# Patient Record
Sex: Male | Born: 1935 | Race: White | Hispanic: No | Marital: Married | Smoking: Former smoker
Health system: Southern US, Community
[De-identification: ages and names within clinical notes are randomized; demographics above are authoritative.]

## PROBLEM LIST (undated history)

## (undated) DIAGNOSIS — R39198 Other difficulties with micturition: Secondary | ICD-10-CM

## (undated) DIAGNOSIS — I480 Paroxysmal atrial fibrillation: Secondary | ICD-10-CM

## (undated) DIAGNOSIS — R0981 Nasal congestion: Secondary | ICD-10-CM

## (undated) DIAGNOSIS — I10 Essential (primary) hypertension: Secondary | ICD-10-CM

## (undated) DIAGNOSIS — C787 Secondary malignant neoplasm of liver and intrahepatic bile duct: Secondary | ICD-10-CM

## (undated) DIAGNOSIS — C801 Malignant (primary) neoplasm, unspecified: Secondary | ICD-10-CM

## (undated) DIAGNOSIS — I639 Cerebral infarction, unspecified: Secondary | ICD-10-CM

## (undated) DIAGNOSIS — M1990 Unspecified osteoarthritis, unspecified site: Secondary | ICD-10-CM

## (undated) DIAGNOSIS — K589 Irritable bowel syndrome without diarrhea: Secondary | ICD-10-CM

## (undated) DIAGNOSIS — R410 Disorientation, unspecified: Secondary | ICD-10-CM

## (undated) DIAGNOSIS — R531 Weakness: Secondary | ICD-10-CM

## (undated) DIAGNOSIS — E785 Hyperlipidemia, unspecified: Secondary | ICD-10-CM

## (undated) DIAGNOSIS — I495 Sick sinus syndrome: Secondary | ICD-10-CM

## (undated) DIAGNOSIS — N189 Chronic kidney disease, unspecified: Secondary | ICD-10-CM

## (undated) DIAGNOSIS — Z972 Presence of dental prosthetic device (complete) (partial): Secondary | ICD-10-CM

## (undated) DIAGNOSIS — K921 Melena: Secondary | ICD-10-CM

## (undated) HISTORY — DX: Unspecified osteoarthritis, unspecified site: M19.90

## (undated) HISTORY — DX: Hyperlipidemia, unspecified: E78.5

## (undated) HISTORY — DX: Melena: K92.1

## (undated) HISTORY — DX: Disorientation, unspecified: R41.0

## (undated) HISTORY — DX: Secondary malignant neoplasm of liver and intrahepatic bile duct: C78.7

## (undated) HISTORY — DX: Cerebral infarction, unspecified: I63.9

## (undated) HISTORY — DX: Presence of dental prosthetic device (complete) (partial): Z97.2

## (undated) HISTORY — DX: Paroxysmal atrial fibrillation: I48.0

## (undated) HISTORY — DX: Essential (primary) hypertension: I10

## (undated) HISTORY — DX: Weakness: R53.1

## (undated) HISTORY — DX: Chronic kidney disease, unspecified: N18.9

## (undated) HISTORY — DX: Sick sinus syndrome: I49.5

## (undated) HISTORY — DX: Other difficulties with micturition: R39.198

## (undated) HISTORY — PX: HERNIA REPAIR: SHX51

## (undated) HISTORY — DX: Nasal congestion: R09.81

## (undated) HISTORY — DX: Malignant (primary) neoplasm, unspecified: C80.1

## (undated) HISTORY — DX: Irritable bowel syndrome, unspecified: K58.9

## (undated) HISTORY — PX: LEG SURGERY: SHX1003

---

## 2003-02-16 ENCOUNTER — Encounter: Payer: Self-pay | Admitting: Emergency Medicine

## 2003-02-16 ENCOUNTER — Emergency Department (HOSPITAL_COMMUNITY): Admission: EM | Admit: 2003-02-16 | Discharge: 2003-02-16 | Payer: Self-pay | Admitting: Emergency Medicine

## 2007-05-17 ENCOUNTER — Inpatient Hospital Stay (HOSPITAL_COMMUNITY): Admission: EM | Admit: 2007-05-17 | Discharge: 2007-05-20 | Payer: Self-pay | Admitting: Emergency Medicine

## 2007-05-19 ENCOUNTER — Encounter (INDEPENDENT_AMBULATORY_CARE_PROVIDER_SITE_OTHER): Payer: Self-pay | Admitting: Internal Medicine

## 2008-10-29 HISTORY — PX: COLON SURGERY: SHX602

## 2008-10-29 HISTORY — PX: RETINAL DETACHMENT SURGERY: SHX105

## 2010-01-27 DIAGNOSIS — K921 Melena: Secondary | ICD-10-CM

## 2010-01-27 HISTORY — DX: Melena: K92.1

## 2010-01-31 ENCOUNTER — Inpatient Hospital Stay (HOSPITAL_COMMUNITY): Admission: EM | Admit: 2010-01-31 | Discharge: 2010-02-09 | Payer: Self-pay | Admitting: Emergency Medicine

## 2010-02-23 ENCOUNTER — Ambulatory Visit: Payer: Self-pay | Admitting: Hematology and Oncology

## 2010-04-05 ENCOUNTER — Ambulatory Visit (HOSPITAL_BASED_OUTPATIENT_CLINIC_OR_DEPARTMENT_OTHER): Payer: Medicare Other | Admitting: Hematology and Oncology

## 2010-04-05 HISTORY — PX: NM MYOCAR PERF WALL MOTION: HXRAD629

## 2010-06-01 ENCOUNTER — Inpatient Hospital Stay (HOSPITAL_COMMUNITY): Admission: RE | Admit: 2010-06-01 | Discharge: 2010-06-12 | Payer: Self-pay | Admitting: General Surgery

## 2010-06-01 ENCOUNTER — Encounter (INDEPENDENT_AMBULATORY_CARE_PROVIDER_SITE_OTHER): Payer: Self-pay | Admitting: General Surgery

## 2010-11-22 ENCOUNTER — Other Ambulatory Visit: Payer: Self-pay | Admitting: Hematology and Oncology

## 2010-11-22 DIAGNOSIS — Z09 Encounter for follow-up examination after completed treatment for conditions other than malignant neoplasm: Secondary | ICD-10-CM

## 2010-12-01 ENCOUNTER — Encounter: Payer: Medicare Other | Admitting: Hematology and Oncology

## 2010-12-01 ENCOUNTER — Ambulatory Visit (HOSPITAL_COMMUNITY)
Admission: RE | Admit: 2010-12-01 | Discharge: 2010-12-01 | Disposition: A | Discharge status: Home / Self Care | Payer: Medicare Other | Source: Ambulatory Visit | Attending: Hematology and Oncology | Admitting: Hematology and Oncology

## 2010-12-01 DIAGNOSIS — Z9889 Other specified postprocedural states: Secondary | ICD-10-CM | POA: Insufficient documentation

## 2010-12-01 DIAGNOSIS — N281 Cyst of kidney, acquired: Secondary | ICD-10-CM | POA: Insufficient documentation

## 2010-12-01 DIAGNOSIS — Z09 Encounter for follow-up examination after completed treatment for conditions other than malignant neoplasm: Secondary | ICD-10-CM

## 2010-12-01 DIAGNOSIS — C494 Malignant neoplasm of connective and soft tissue of abdomen: Secondary | ICD-10-CM

## 2010-12-01 DIAGNOSIS — D4819 Other specified neoplasm of uncertain behavior of connective and other soft tissue: Secondary | ICD-10-CM | POA: Insufficient documentation

## 2010-12-01 DIAGNOSIS — K7689 Other specified diseases of liver: Secondary | ICD-10-CM | POA: Insufficient documentation

## 2010-12-01 DIAGNOSIS — D481 Neoplasm of uncertain behavior of connective and other soft tissue: Secondary | ICD-10-CM | POA: Insufficient documentation

## 2010-12-01 DIAGNOSIS — N400 Enlarged prostate without lower urinary tract symptoms: Secondary | ICD-10-CM | POA: Insufficient documentation

## 2010-12-01 DIAGNOSIS — K439 Ventral hernia without obstruction or gangrene: Secondary | ICD-10-CM | POA: Insufficient documentation

## 2010-12-01 DIAGNOSIS — N3289 Other specified disorders of bladder: Secondary | ICD-10-CM | POA: Insufficient documentation

## 2010-12-01 DIAGNOSIS — K639 Disease of intestine, unspecified: Secondary | ICD-10-CM

## 2010-12-01 LAB — CBC WITH DIFFERENTIAL/PLATELET
BASO%: 0.5 % (ref 0.0–2.0)
Basophils Absolute: 0 10*3/uL (ref 0.0–0.1)
EOS%: 3.9 % (ref 0.0–7.0)
HCT: 38.2 % — ABNORMAL LOW (ref 38.4–49.9)
LYMPH%: 30.4 % (ref 14.0–49.0)
MCH: 29.3 pg (ref 27.2–33.4)
MCHC: 33.9 g/dL (ref 32.0–36.0)
MONO#: 0.5 10*3/uL (ref 0.1–0.9)
MONO%: 10.4 % (ref 0.0–14.0)
NEUT#: 2.6 10*3/uL (ref 1.5–6.5)
NEUT%: 54.8 % (ref 39.0–75.0)
Platelets: 124 10*3/uL — ABNORMAL LOW (ref 140–400)
RBC: 4.42 10*6/uL (ref 4.20–5.82)
RDW: 13.9 % (ref 11.0–14.6)
lymph#: 1.4 10*3/uL (ref 0.9–3.3)

## 2010-12-01 LAB — CMP (CANCER CENTER ONLY)
AST: 20 U/L (ref 11–38)
BUN, Bld: 19 mg/dL (ref 7–22)
CO2: 28 mEq/L (ref 18–33)
Chloride: 101 mEq/L (ref 98–108)
Glucose, Bld: 122 mg/dL — ABNORMAL HIGH (ref 73–118)
Potassium: 4.4 mEq/L (ref 3.3–4.7)
Sodium: 139 mEq/L (ref 128–145)

## 2010-12-01 LAB — CEA: CEA: 1.3 ng/mL (ref 0.0–5.0)

## 2010-12-01 LAB — TECHNOLOGIST REVIEW

## 2010-12-01 MED ORDER — IOHEXOL 300 MG/ML  SOLN: 100.0000 mL | Freq: Once | INTRAMUSCULAR | Status: AC | PRN

## 2010-12-13 ENCOUNTER — Encounter (HOSPITAL_BASED_OUTPATIENT_CLINIC_OR_DEPARTMENT_OTHER): Payer: Medicare Other | Admitting: Hematology and Oncology

## 2010-12-13 DIAGNOSIS — C494 Malignant neoplasm of connective and soft tissue of abdomen: Secondary | ICD-10-CM

## 2010-12-13 DIAGNOSIS — K639 Disease of intestine, unspecified: Secondary | ICD-10-CM

## 2010-12-18 ENCOUNTER — Other Ambulatory Visit: Payer: Self-pay | Admitting: Hematology and Oncology

## 2010-12-18 DIAGNOSIS — C49A0 Gastrointestinal stromal tumor, unspecified site: Secondary | ICD-10-CM

## 2011-01-11 ENCOUNTER — Other Ambulatory Visit: Payer: Self-pay | Admitting: Internal Medicine

## 2011-01-11 DIAGNOSIS — F0390 Unspecified dementia without behavioral disturbance: Secondary | ICD-10-CM

## 2011-01-12 LAB — GLUCOSE, CAPILLARY
Glucose-Capillary: 96 mg/dL (ref 70–99)
Glucose-Capillary: 103 mg/dL — ABNORMAL HIGH (ref 70–99)
Glucose-Capillary: 113 mg/dL — ABNORMAL HIGH (ref 70–99)
Glucose-Capillary: 118 mg/dL — ABNORMAL HIGH (ref 70–99)
Glucose-Capillary: 120 mg/dL — ABNORMAL HIGH (ref 70–99)
Glucose-Capillary: 121 mg/dL — ABNORMAL HIGH (ref 70–99)
Glucose-Capillary: 129 mg/dL — ABNORMAL HIGH (ref 70–99)
Glucose-Capillary: 134 mg/dL — ABNORMAL HIGH (ref 70–99)
Glucose-Capillary: 135 mg/dL — ABNORMAL HIGH (ref 70–99)
Glucose-Capillary: 141 mg/dL — ABNORMAL HIGH (ref 70–99)
Glucose-Capillary: 148 mg/dL — ABNORMAL HIGH (ref 70–99)
Glucose-Capillary: 157 mg/dL — ABNORMAL HIGH (ref 70–99)
Glucose-Capillary: 157 mg/dL — ABNORMAL HIGH (ref 70–99)
Glucose-Capillary: 159 mg/dL — ABNORMAL HIGH (ref 70–99)
Glucose-Capillary: 164 mg/dL — ABNORMAL HIGH (ref 70–99)
Glucose-Capillary: 164 mg/dL — ABNORMAL HIGH (ref 70–99)
Glucose-Capillary: 166 mg/dL — ABNORMAL HIGH (ref 70–99)
Glucose-Capillary: 166 mg/dL — ABNORMAL HIGH (ref 70–99)
Glucose-Capillary: 169 mg/dL — ABNORMAL HIGH (ref 70–99)
Glucose-Capillary: 178 mg/dL — ABNORMAL HIGH (ref 70–99)
Glucose-Capillary: 179 mg/dL — ABNORMAL HIGH (ref 70–99)
Glucose-Capillary: 187 mg/dL — ABNORMAL HIGH (ref 70–99)
Glucose-Capillary: 188 mg/dL — ABNORMAL HIGH (ref 70–99)
Glucose-Capillary: 192 mg/dL — ABNORMAL HIGH (ref 70–99)
Glucose-Capillary: 197 mg/dL — ABNORMAL HIGH (ref 70–99)
Glucose-Capillary: 198 mg/dL — ABNORMAL HIGH (ref 70–99)
Glucose-Capillary: 204 mg/dL — ABNORMAL HIGH (ref 70–99)
Glucose-Capillary: 219 mg/dL — ABNORMAL HIGH (ref 70–99)
Glucose-Capillary: 228 mg/dL — ABNORMAL HIGH (ref 70–99)

## 2011-01-12 LAB — CBC
HCT: 30.4 % — ABNORMAL LOW (ref 39.0–52.0)
HCT: 28.1 % — ABNORMAL LOW (ref 39.0–52.0)
HCT: 28.8 % — ABNORMAL LOW (ref 39.0–52.0)
HCT: 30.6 % — ABNORMAL LOW (ref 39.0–52.0)
HCT: 30.7 % — ABNORMAL LOW (ref 39.0–52.0)
HCT: 32.3 % — ABNORMAL LOW (ref 39.0–52.0)
HCT: 34.2 % — ABNORMAL LOW (ref 39.0–52.0)
Hemoglobin: 10.4 g/dL — ABNORMAL LOW (ref 13.0–17.0)
Hemoglobin: 11.3 g/dL — ABNORMAL LOW (ref 13.0–17.0)
Hemoglobin: 9.4 g/dL — ABNORMAL LOW (ref 13.0–17.0)
Hemoglobin: 9.6 g/dL — ABNORMAL LOW (ref 13.0–17.0)
Hemoglobin: 9.6 g/dL — ABNORMAL LOW (ref 13.0–17.0)
MCH: 27 pg (ref 26.0–34.0)
MCH: 27.5 pg (ref 26.0–34.0)
MCH: 27.5 pg (ref 26.0–34.0)
MCH: 27.9 pg (ref 26.0–34.0)
MCHC: 33.2 g/dL (ref 30.0–36.0)
MCHC: 33.2 g/dL (ref 30.0–36.0)
MCHC: 33.3 g/dL (ref 30.0–36.0)
MCHC: 33.5 g/dL (ref 30.0–36.0)
MCHC: 33.6 g/dL (ref 30.0–36.0)
MCHC: 33.6 g/dL (ref 30.0–36.0)
MCHC: 33.6 g/dL (ref 30.0–36.0)
MCV: 81.4 fL (ref 78.0–100.0)
MCV: 81.6 fL (ref 78.0–100.0)
MCV: 82.6 fL (ref 78.0–100.0)
MCV: 82.8 fL (ref 78.0–100.0)
MCV: 83.6 fL (ref 78.0–100.0)
Platelets: 260 10*3/uL (ref 150–400)
Platelets: 143 10*3/uL — ABNORMAL LOW (ref 150–400)
Platelets: 161 10*3/uL (ref 150–400)
RBC: 3.54 MIL/uL — ABNORMAL LOW (ref 4.22–5.81)
RBC: 3.86 MIL/uL — ABNORMAL LOW (ref 4.22–5.81)
RBC: 4.08 MIL/uL — ABNORMAL LOW (ref 4.22–5.81)
RDW: 13.6 % (ref 11.5–15.5)
RDW: 13.7 % (ref 11.5–15.5)
RDW: 13.7 % (ref 11.5–15.5)
RDW: 13.8 % (ref 11.5–15.5)
RDW: 14 % (ref 11.5–15.5)
RDW: 14.1 % (ref 11.5–15.5)
WBC: 5.3 10*3/uL (ref 4.0–10.5)
WBC: 6.7 10*3/uL (ref 4.0–10.5)
WBC: 6.9 10*3/uL (ref 4.0–10.5)
WBC: 7 10*3/uL (ref 4.0–10.5)
WBC: 7.5 10*3/uL (ref 4.0–10.5)

## 2011-01-12 LAB — DIFFERENTIAL
Basophils Absolute: 0 10*3/uL (ref 0.0–0.1)
Basophils Relative: 0 % (ref 0–1)
Eosinophils Absolute: 0 10*3/uL (ref 0.0–0.7)
Eosinophils Relative: 0 % (ref 0–5)
Monocytes Absolute: 0.3 10*3/uL (ref 0.1–1.0)

## 2011-01-12 LAB — PROTIME-INR
INR: 1.05 (ref 0.00–1.49)
INR: 1.23 (ref 0.00–1.49)
INR: 1.27 (ref 0.00–1.49)
INR: 1.34 (ref 0.00–1.49)
Prothrombin Time: 18.1 seconds — ABNORMAL HIGH (ref 11.6–15.2)
Prothrombin Time: 15.7 seconds — ABNORMAL HIGH (ref 11.6–15.2)
Prothrombin Time: 16.1 seconds — ABNORMAL HIGH (ref 11.6–15.2)
Prothrombin Time: 16.8 seconds — ABNORMAL HIGH (ref 11.6–15.2)

## 2011-01-12 LAB — BASIC METABOLIC PANEL
BUN: 12 mg/dL (ref 6–23)
BUN: 19 mg/dL (ref 6–23)
CO2: 22 mEq/L (ref 19–32)
CO2: 26 mEq/L (ref 19–32)
CO2: 27 mEq/L (ref 19–32)
CO2: 28 mEq/L (ref 19–32)
Calcium: 8 mg/dL — ABNORMAL LOW (ref 8.4–10.5)
Calcium: 8.6 mg/dL (ref 8.4–10.5)
Chloride: 104 mEq/L (ref 96–112)
Chloride: 105 mEq/L (ref 96–112)
Chloride: 107 mEq/L (ref 96–112)
Creatinine, Ser: 1.03 mg/dL (ref 0.4–1.5)
Creatinine, Ser: 1.09 mg/dL (ref 0.4–1.5)
GFR calc Af Amer: >60 mL/min (ref >60)
GFR calc Af Amer: >60 mL/min (ref >60)
GFR calc Af Amer: >60 mL/min (ref >60)
GFR calc non Af Amer: 56 mL/min — ABNORMAL LOW (ref >60)
GFR calc non Af Amer: >60 mL/min (ref >60)
Glucose, Bld: 115 mg/dL — ABNORMAL HIGH (ref 70–99)
Glucose, Bld: 138 mg/dL — ABNORMAL HIGH (ref 70–99)
Glucose, Bld: 160 mg/dL — ABNORMAL HIGH (ref 70–99)
Glucose, Bld: 181 mg/dL — ABNORMAL HIGH (ref 70–99)
Potassium: 3.4 mEq/L — ABNORMAL LOW (ref 3.5–5.1)
Potassium: 3.9 mEq/L (ref 3.5–5.1)
Potassium: 3.9 mEq/L (ref 3.5–5.1)
Potassium: 4.4 mEq/L (ref 3.5–5.1)
Sodium: 137 mEq/L (ref 135–145)
Sodium: 138 mEq/L (ref 135–145)
Sodium: 139 mEq/L (ref 135–145)
Sodium: 140 mEq/L (ref 135–145)

## 2011-01-12 LAB — CULTURE, BLOOD (ROUTINE X 2): Culture: NO GROWTH

## 2011-01-12 LAB — URINALYSIS, MICROSCOPIC ONLY
Bilirubin Urine: NEGATIVE
Glucose, UA: NEGATIVE mg/dL
Ketones, ur: NEGATIVE mg/dL
pH: 5.5 (ref 5.0–8.0)

## 2011-01-12 LAB — URINE CULTURE
Culture  Setup Time: 201108120907
Special Requests: NEGATIVE

## 2011-01-12 LAB — HEPARIN LEVEL (UNFRACTIONATED)
Heparin Unfractionated: 0.12 IU/mL — ABNORMAL LOW (ref 0.30–0.70)
Heparin Unfractionated: 0.41 IU/mL (ref 0.30–0.70)

## 2011-01-12 LAB — CARDIAC PANEL(CRET KIN+CKTOT+MB+TROPI)
CK, MB: 1.5 ng/mL (ref 0.3–4.0)
Relative Index: 0.5 (ref 0.0–2.5)
Troponin I: 0.03 ng/mL (ref 0.00–0.06)

## 2011-01-12 LAB — APTT: aPTT: 37 seconds (ref 24–37)

## 2011-01-12 LAB — MAGNESIUM
Magnesium: 1.7 mg/dL (ref 1.5–2.5)
Magnesium: 1.8 mg/dL (ref 1.5–2.5)

## 2011-01-12 LAB — HEMOGLOBIN A1C: Mean Plasma Glucose: 137 mg/dL — ABNORMAL HIGH (ref <117)

## 2011-01-12 LAB — MRSA PCR SCREENING: MRSA by PCR: NEGATIVE

## 2011-01-13 LAB — COMPREHENSIVE METABOLIC PANEL
ALT: 10 U/L (ref 0–53)
AST: 15 U/L (ref 0–37)
Albumin: 3.6 g/dL (ref 3.5–5.2)
CO2: 28 mEq/L (ref 19–32)
Chloride: 104 mEq/L (ref 96–112)
GFR calc Af Amer: >60 mL/min (ref >60)
GFR calc non Af Amer: >60 mL/min (ref >60)
Sodium: 139 mEq/L (ref 135–145)
Total Bilirubin: 0.4 mg/dL (ref 0.3–1.2)

## 2011-01-13 LAB — APTT: aPTT: 28 seconds (ref 24–37)

## 2011-01-13 LAB — CBC
HCT: 34.3 % — ABNORMAL LOW (ref 39.0–52.0)
Hemoglobin: 11.8 g/dL — ABNORMAL LOW (ref 13.0–17.0)
RBC: 4.01 MIL/uL — ABNORMAL LOW (ref 4.22–5.81)
WBC: 4.6 10*3/uL (ref 4.0–10.5)

## 2011-01-13 LAB — CEA: CEA: 1.1 ng/mL (ref 0.0–5.0)

## 2011-01-13 LAB — DIFFERENTIAL
Basophils Absolute: 0 10*3/uL (ref 0.0–0.1)
Basophils Relative: 1 % (ref 0–1)
Lymphocytes Relative: 22 % (ref 12–46)
Monocytes Absolute: 0.4 10*3/uL (ref 0.1–1.0)
Monocytes Relative: 9 % (ref 3–12)
Neutro Abs: 3.1 10*3/uL (ref 1.7–7.7)
Neutrophils Relative %: 67 % (ref 43–77)

## 2011-01-13 LAB — PROTIME-INR: INR: 1 (ref 0.00–1.49)

## 2011-01-13 LAB — SURGICAL PCR SCREEN: Staphylococcus aureus: NEGATIVE

## 2011-01-17 LAB — CBC
HCT: 23.1 % — ABNORMAL LOW (ref 39.0–52.0)
HCT: 23.9 % — ABNORMAL LOW (ref 39.0–52.0)
HCT: 24.3 % — ABNORMAL LOW (ref 39.0–52.0)
HCT: 26.4 % — ABNORMAL LOW (ref 39.0–52.0)
HCT: 27.6 % — ABNORMAL LOW (ref 39.0–52.0)
HCT: 29.7 % — ABNORMAL LOW (ref 39.0–52.0)
Hemoglobin: 10 g/dL — ABNORMAL LOW (ref 13.0–17.0)
Hemoglobin: 8.9 g/dL — ABNORMAL LOW (ref 13.0–17.0)
Hemoglobin: 9.4 g/dL — ABNORMAL LOW (ref 13.0–17.0)
MCHC: 33.2 g/dL (ref 30.0–36.0)
MCHC: 33.4 g/dL (ref 30.0–36.0)
MCHC: 33.6 g/dL (ref 30.0–36.0)
MCHC: 33.8 g/dL (ref 30.0–36.0)
MCHC: 34 g/dL (ref 30.0–36.0)
MCHC: 34.1 g/dL (ref 30.0–36.0)
MCHC: 34.2 g/dL (ref 30.0–36.0)
MCHC: 34.5 g/dL (ref 30.0–36.0)
MCV: 85.1 fL (ref 78.0–100.0)
MCV: 86.4 fL (ref 78.0–100.0)
MCV: 87.2 fL (ref 78.0–100.0)
MCV: 87.3 fL (ref 78.0–100.0)
MCV: 87.3 fL (ref 78.0–100.0)
MCV: 87.4 fL (ref 78.0–100.0)
MCV: 88.3 fL (ref 78.0–100.0)
MCV: 88.8 fL (ref 78.0–100.0)
Platelets: 101 10*3/uL — ABNORMAL LOW (ref 150–400)
Platelets: 113 10*3/uL — ABNORMAL LOW (ref 150–400)
Platelets: 123 10*3/uL — ABNORMAL LOW (ref 150–400)
Platelets: 124 10*3/uL — ABNORMAL LOW (ref 150–400)
Platelets: 150 10*3/uL (ref 150–400)
Platelets: 150 10*3/uL (ref 150–400)
Platelets: 84 10*3/uL — ABNORMAL LOW (ref 150–400)
RBC: 2.78 MIL/uL — ABNORMAL LOW (ref 4.22–5.81)
RBC: 2.8 MIL/uL — ABNORMAL LOW (ref 4.22–5.81)
RBC: 2.81 MIL/uL — ABNORMAL LOW (ref 4.22–5.81)
RBC: 3.03 MIL/uL — ABNORMAL LOW (ref 4.22–5.81)
RBC: 3.12 MIL/uL — ABNORMAL LOW (ref 4.22–5.81)
RBC: 3.43 MIL/uL — ABNORMAL LOW (ref 4.22–5.81)
RDW: 14.1 % (ref 11.5–15.5)
RDW: 14.2 % (ref 11.5–15.5)
RDW: 14.3 % (ref 11.5–15.5)
RDW: 14.9 % (ref 11.5–15.5)
WBC: 4 10*3/uL (ref 4.0–10.5)
WBC: 4.1 10*3/uL (ref 4.0–10.5)
WBC: 4.4 10*3/uL (ref 4.0–10.5)
WBC: 4.7 10*3/uL (ref 4.0–10.5)
WBC: 4.7 10*3/uL (ref 4.0–10.5)
WBC: 5.1 10*3/uL (ref 4.0–10.5)
WBC: 5.1 10*3/uL (ref 4.0–10.5)

## 2011-01-17 LAB — DIFFERENTIAL
Basophils Absolute: 0 10*3/uL (ref 0.0–0.1)
Basophils Relative: 0 % (ref 0–1)
Basophils Relative: 0 % (ref 0–1)
Eosinophils Absolute: 0 10*3/uL (ref 0.0–0.7)
Eosinophils Absolute: 0 10*3/uL (ref 0.0–0.7)
Eosinophils Absolute: 0.1 10*3/uL (ref 0.0–0.7)
Eosinophils Relative: 1 % (ref 0–5)
Lymphs Abs: 1.2 10*3/uL (ref 0.7–4.0)
Lymphs Abs: 1.9 10*3/uL (ref 0.7–4.0)
Monocytes Absolute: 0.4 10*3/uL (ref 0.1–1.0)
Monocytes Relative: 9 % (ref 3–12)
Monocytes Relative: 9 % (ref 3–12)
Monocytes Relative: 9 % (ref 3–12)
Neutro Abs: 3.1 10*3/uL (ref 1.7–7.7)
Neutrophils Relative %: 56 % (ref 43–77)
Neutrophils Relative %: 70 % (ref 43–77)

## 2011-01-17 LAB — GLUCOSE, CAPILLARY
Glucose-Capillary: 101 mg/dL — ABNORMAL HIGH (ref 70–99)
Glucose-Capillary: 108 mg/dL — ABNORMAL HIGH (ref 70–99)
Glucose-Capillary: 111 mg/dL — ABNORMAL HIGH (ref 70–99)
Glucose-Capillary: 113 mg/dL — ABNORMAL HIGH (ref 70–99)
Glucose-Capillary: 121 mg/dL — ABNORMAL HIGH (ref 70–99)
Glucose-Capillary: 126 mg/dL — ABNORMAL HIGH (ref 70–99)
Glucose-Capillary: 132 mg/dL — ABNORMAL HIGH (ref 70–99)
Glucose-Capillary: 134 mg/dL — ABNORMAL HIGH (ref 70–99)
Glucose-Capillary: 137 mg/dL — ABNORMAL HIGH (ref 70–99)
Glucose-Capillary: 148 mg/dL — ABNORMAL HIGH (ref 70–99)
Glucose-Capillary: 151 mg/dL — ABNORMAL HIGH (ref 70–99)
Glucose-Capillary: 152 mg/dL — ABNORMAL HIGH (ref 70–99)
Glucose-Capillary: 154 mg/dL — ABNORMAL HIGH (ref 70–99)
Glucose-Capillary: 159 mg/dL — ABNORMAL HIGH (ref 70–99)
Glucose-Capillary: 160 mg/dL — ABNORMAL HIGH (ref 70–99)
Glucose-Capillary: 166 mg/dL — ABNORMAL HIGH (ref 70–99)
Glucose-Capillary: 169 mg/dL — ABNORMAL HIGH (ref 70–99)
Glucose-Capillary: 176 mg/dL — ABNORMAL HIGH (ref 70–99)
Glucose-Capillary: 184 mg/dL — ABNORMAL HIGH (ref 70–99)
Glucose-Capillary: 205 mg/dL — ABNORMAL HIGH (ref 70–99)
Glucose-Capillary: 222 mg/dL — ABNORMAL HIGH (ref 70–99)

## 2011-01-17 LAB — TYPE AND SCREEN
ABO/RH(D): O POS
Antibody Screen: NEGATIVE

## 2011-01-17 LAB — BASIC METABOLIC PANEL
BUN: 13 mg/dL (ref 6–23)
BUN: 17 mg/dL (ref 6–23)
CO2: 29 mEq/L (ref 19–32)
Calcium: 7.7 mg/dL — ABNORMAL LOW (ref 8.4–10.5)
Calcium: 8.3 mg/dL — ABNORMAL LOW (ref 8.4–10.5)
Chloride: 109 mEq/L (ref 96–112)
Creatinine, Ser: 1.02 mg/dL (ref 0.4–1.5)
Creatinine, Ser: 1.13 mg/dL (ref 0.4–1.5)
GFR calc Af Amer: >60 mL/min (ref >60)
GFR calc Af Amer: >60 mL/min (ref >60)
GFR calc non Af Amer: >60 mL/min (ref >60)
Glucose, Bld: 139 mg/dL — ABNORMAL HIGH (ref 70–99)
Sodium: 138 mEq/L (ref 135–145)

## 2011-01-17 LAB — COMPREHENSIVE METABOLIC PANEL
ALT: 12 U/L (ref 0–53)
ALT: 15 U/L (ref 0–53)
AST: 29 U/L (ref 0–37)
Albumin: 2.6 g/dL — ABNORMAL LOW (ref 3.5–5.2)
Albumin: 2.7 g/dL — ABNORMAL LOW (ref 3.5–5.2)
Albumin: 3.2 g/dL — ABNORMAL LOW (ref 3.5–5.2)
Alkaline Phosphatase: 54 U/L (ref 39–117)
BUN: 10 mg/dL (ref 6–23)
BUN: 15 mg/dL (ref 6–23)
BUN: 17 mg/dL (ref 6–23)
BUN: 29 mg/dL — ABNORMAL HIGH (ref 6–23)
CO2: 25 mEq/L (ref 19–32)
CO2: 27 mEq/L (ref 19–32)
Calcium: 8.1 mg/dL — ABNORMAL LOW (ref 8.4–10.5)
Calcium: 8.2 mg/dL — ABNORMAL LOW (ref 8.4–10.5)
Calcium: 8.3 mg/dL — ABNORMAL LOW (ref 8.4–10.5)
Calcium: 8.7 mg/dL (ref 8.4–10.5)
Chloride: 107 mEq/L (ref 96–112)
Chloride: 109 mEq/L (ref 96–112)
Creatinine, Ser: 1.01 mg/dL (ref 0.4–1.5)
Creatinine, Ser: 1.26 mg/dL (ref 0.4–1.5)
Creatinine, Ser: 1.29 mg/dL (ref 0.4–1.5)
GFR calc Af Amer: >60 mL/min (ref >60)
GFR calc non Af Amer: 54 mL/min — ABNORMAL LOW (ref >60)
GFR calc non Af Amer: >60 mL/min (ref >60)
GFR calc non Af Amer: >60 mL/min (ref >60)
Glucose, Bld: 170 mg/dL — ABNORMAL HIGH (ref 70–99)
Glucose, Bld: 170 mg/dL — ABNORMAL HIGH (ref 70–99)
Potassium: 4.3 mEq/L (ref 3.5–5.1)
Sodium: 137 mEq/L (ref 135–145)
Sodium: 142 mEq/L (ref 135–145)
Total Bilirubin: 0.4 mg/dL (ref 0.3–1.2)
Total Bilirubin: 0.4 mg/dL (ref 0.3–1.2)
Total Protein: 4.8 g/dL — ABNORMAL LOW (ref 6.0–8.3)
Total Protein: 5.1 g/dL — ABNORMAL LOW (ref 6.0–8.3)
Total Protein: 5.1 g/dL — ABNORMAL LOW (ref 6.0–8.3)
Total Protein: 5.2 g/dL — ABNORMAL LOW (ref 6.0–8.3)

## 2011-01-17 LAB — PREPARE FRESH FROZEN PLASMA

## 2011-01-17 LAB — PROTIME-INR
INR: 1.09 (ref 0.00–1.49)
INR: 1.4 (ref 0.00–1.49)
Prothrombin Time: 14 seconds (ref 11.6–15.2)
Prothrombin Time: 17 seconds — ABNORMAL HIGH (ref 11.6–15.2)
Prothrombin Time: 26 seconds — ABNORMAL HIGH (ref 11.6–15.2)

## 2011-01-17 LAB — LIPID PANEL
Cholesterol: 100 mg/dL (ref 0–200)
HDL: 29 mg/dL — ABNORMAL LOW (ref >39)
LDL Cholesterol: 55 mg/dL (ref 0–99)
Total CHOL/HDL Ratio: 3.4 RATIO

## 2011-01-17 LAB — URINALYSIS, ROUTINE W REFLEX MICROSCOPIC
Glucose, UA: 250 mg/dL — AB
Hgb urine dipstick: NEGATIVE
Ketones, ur: NEGATIVE mg/dL
Protein, ur: NEGATIVE mg/dL
Urobilinogen, UA: 0.2 mg/dL (ref 0.0–1.0)

## 2011-01-17 LAB — URINE CULTURE: Colony Count: NO GROWTH

## 2011-01-17 LAB — HEMOCCULT GUIAC POC 1CARD (OFFICE): Fecal Occult Bld: POSITIVE

## 2011-01-17 LAB — MAGNESIUM: Magnesium: 1.9 mg/dL (ref 1.5–2.5)

## 2011-01-17 LAB — URINE MICROSCOPIC-ADD ON

## 2011-01-17 LAB — HEMOGLOBIN A1C: Mean Plasma Glucose: 154 mg/dL

## 2011-01-17 LAB — HEMOGLOBIN AND HEMATOCRIT, BLOOD: HCT: 28 % — ABNORMAL LOW (ref 39.0–52.0)

## 2011-01-18 ENCOUNTER — Ambulatory Visit
Admission: RE | Admit: 2011-01-18 | Discharge: 2011-01-18 | Disposition: A | Discharge status: Home / Self Care | Payer: Medicare Other | Source: Ambulatory Visit | Attending: Internal Medicine | Admitting: Internal Medicine

## 2011-01-18 DIAGNOSIS — F0390 Unspecified dementia without behavioral disturbance: Secondary | ICD-10-CM

## 2011-03-06 ENCOUNTER — Encounter (INDEPENDENT_AMBULATORY_CARE_PROVIDER_SITE_OTHER): Payer: Self-pay | Admitting: General Surgery

## 2011-03-13 NOTE — H&P (Signed)
NAMETALIS, IWAN NO.:  [id number]   MEDICAL RECORD NO.:  [id number]          PATIENT TYPE:  EMS   LOCATION:  MAJO                         FACILITY:  [facility]   PHYSICIAN:  Gardiner Barefoot, MD    DATE OF BIRTH:  [birth year]   DATE OF ADMISSION:  05/17/2007  DATE OF DISCHARGE:                              HISTORY & PHYSICAL   PRIMARY CARE PHYSICIAN:  Dr. Selena Batten of [facility]   CHIEF COMPLAINT:  Palpitations.   HISTORY OF PRESENT ILLNESS:  This is an 77 year old male with a remote  history of smoking, hypertension and hyperlipidemia who presents with  acute onset of palpitations about 11 a.m. this morning and felt to be a  rapid heart rate.  He was brought in the emergency room and found to be  in rapid AFib.  No complaint of nausea, chest pain.  Patient did report  some diaphoresis.  No history of leg swelling and no history of any  thyroid problems.   PAST MEDICAL HISTORY:  1. Hypertension.  2. Hypercholesterolemia.  3. Benign prostatic hypertrophy.   MEDICATIONS:  1. Micardis 40 mg daily.  2. Hydrochlorothiazide 12.5 mg p.o. daily.  3. Pravastatin 20 mg daily.  4. Avodart 0.5 mg daily.  5. Alfuzosin 10 mg p.o. daily.   ALLERGIES:  CODEINE.   SOCIAL HISTORY:  Remote history of tobacco use and denies any drug or  alcohol use.  [place]   FAMILY HISTORY:  Brother with an MI, no known cardiac death in the  family.   REVIEW OF SYSTEMS:  All systems reviewed and negative other than that  presented in the History of Present Illness.   PHYSICAL EXAMINATION:  VITALS:  Temperature is 97.5, pulse is 108,  respirations 22, blood pressure is 126/72.  GENERAL:  The patient is awake, alert and oriented x3 and appears in no  acute distress.  CARDIOVASCULAR:  Irregularly irregular rhythm.  No murmurs appreciated.  No JVD.  CHEST:  Clear to auscultation bilaterally.  EXTREMITIES:  No cyanosis, clubbing or edema.   LABORATORY DATA:   Sodium is 138, potassium 3.8, chloride 108, bicarb 23,  BUN 21, creatinine 1.2, glucose is 171, hemoglobin 14.6, CK-MB 1.1 with  a troponin of less than 0.05.   Chest x-ray report is negative for infiltrate or cardiomegaly.   IMPRESSION:  An 77 year old with new-onset AFib and newly diagnosed  diabetes.   1. Rapid AFib.  The patient is on a Cardizem drip now with good      response; will be continued on the Cardizem drip and then      transitioned to p.o. medications.  Will also follow patient's      cardiac enzymes to ensure it is not a myocardial infarction as well      as TSH and transition him to p.o. medicines as tolerated.  2. Hyperglycemia.  The patient likely has an undiagnosed mild      diabetes.  Will check a hemoglobin A1c and follow his glucose.      Gardiner Barefoot, MD  Electronically Signed  RWC/MEDQ  D:  05/17/2007  T:  05/18/2007  Job:  [id number]

## 2011-03-13 NOTE — Discharge Summary (Signed)
Phillip Chapman, Phillip Chapman               ACCOUNT NO.:  [id number]   MEDICAL RECORD NO.:  [id number]          PATIENT TYPE:  INP   LOCATION:  3703                         FACILITY:  [facility]   PHYSICIAN:  Wilson Singer, M.D.DATE OF BIRTH:  07-Jul-1921   DATE OF ADMISSION:  05/17/2007  DATE OF DISCHARGE:  05/20/2007                               DISCHARGE SUMMARY   FINAL DISCHARGE DIAGNOSES:  1. Isolated episode of atrial fibrillation.  2. Hypertension.  3. Hypercholesterolemia.  4. Benign prostatic hypertrophy.   MEDICATIONS ON DISCHARGE:  1. Avodart 0.5 mg daily.  2. Pravastatin 20 mg daily.  3. Uroxatral 10 mg daily.  4. Cardizem CD 120 mg daily.  5. Hydrochlorothiazide and Micardis were stopped last in hospital and      should not be started again unless felt appropriate.   CONDITION ON DISCHARGE:  Stable.   HISTORY:  This very pleasant 77 year old man came in with a history of  palpitations which started at 11:00 a.m. on May 17, 2007.  He was noted  to be in atrial fibrillation with a rapid ventricular response.  Please  see initial history and physical examination done by Dr. Staci Righter.   HOSPITAL PROGRESS:  He was started on a Cardizem intravenous drip and  this slowed his ventricular rate down sufficiently enough that he  reconverted back to sinus rhythm at 7:00 a.m. on May 26, 2007, which  means that he had been in atrial fibrillation for a documented 20 hours.  It was not felt appropriate that he should be anticoagulated in view of  this.  Serial cardiac enzymes were not significant for any ischemia.  He  was noted to be hyperglycemic and the hemoglobin A1c was 6.6% with a  fasting glucose of 124.  Also a D-dimer was elevated and in view of  this, he had a CT angio chest which was negative.  Once he had converted  back to sinus rhythm, he did not go back into atrial fibrillation and  remained well throughout his hospitalization.  An echocardiogram was  done but the  results of the report are still pending.   PHYSICAL EXAMINATION:  On physical examination today, he is doing well  and remains in sinus rhythm.  He has no chest pains or palpitations.  On  physical examination, temperature 97.4, blood pressure 143/87, pulse 61,  saturation 97% on room air.  LUNGS:  Lung fields are clear.  HEART:  Heart sounds are present, normal without any murmurs.   FURTHER DISPOSITION:  I am sending him on Cardizem CD and he should  remain on this for the time being.  Micardis and hydrochlorothiazide  have been discontinued here and they may be reinstituted if necessary.  Also, in  view of his hyperglycemia I suggested he needs a 2-hour glucose  tolerance test to see if he actually is diabetic or not.  The results of  the echocardiogram are still pending and the results can be reviewed  once it has been reported on in the next day or two.      Wilson Singer, M.D.  Electronically Signed     NCG/MEDQ  D:  05/20/2007  T:  05/20/2007  Job:  [id number]   cc:   Massie Maroon, MD

## 2011-06-21 ENCOUNTER — Other Ambulatory Visit: Payer: Self-pay | Admitting: Hematology and Oncology

## 2011-06-21 ENCOUNTER — Ambulatory Visit (HOSPITAL_COMMUNITY)
Admission: RE | Admit: 2011-06-21 | Discharge: 2011-06-21 | Disposition: A | Discharge status: Home / Self Care | Payer: Medicare Other | Source: Ambulatory Visit | Attending: Hematology and Oncology | Admitting: Hematology and Oncology

## 2011-06-21 ENCOUNTER — Encounter (HOSPITAL_BASED_OUTPATIENT_CLINIC_OR_DEPARTMENT_OTHER): Payer: Medicare Other | Admitting: Hematology and Oncology

## 2011-06-21 DIAGNOSIS — N138 Other obstructive and reflux uropathy: Secondary | ICD-10-CM | POA: Insufficient documentation

## 2011-06-21 DIAGNOSIS — C49A0 Gastrointestinal stromal tumor, unspecified site: Secondary | ICD-10-CM

## 2011-06-21 DIAGNOSIS — C494 Malignant neoplasm of connective and soft tissue of abdomen: Secondary | ICD-10-CM | POA: Insufficient documentation

## 2011-06-21 DIAGNOSIS — N401 Enlarged prostate with lower urinary tract symptoms: Secondary | ICD-10-CM | POA: Insufficient documentation

## 2011-06-21 DIAGNOSIS — K5730 Diverticulosis of large intestine without perforation or abscess without bleeding: Secondary | ICD-10-CM | POA: Insufficient documentation

## 2011-06-21 LAB — CMP (CANCER CENTER ONLY)
ALT(SGPT): 15 U/L (ref 10–47)
AST: 21 U/L (ref 11–38)
Alkaline Phosphatase: 78 U/L (ref 26–84)
Chloride: 96 mEq/L — ABNORMAL LOW (ref 98–108)
Creat: 1.2 mg/dl (ref 0.6–1.2)
Total Bilirubin: 0.6 mg/dl (ref 0.20–1.60)

## 2011-06-21 LAB — CBC WITH DIFFERENTIAL/PLATELET
Basophils Absolute: 0 10*3/uL (ref 0.0–0.1)
EOS%: 0.8 % (ref 0.0–7.0)
Eosinophils Absolute: 0 10*3/uL (ref 0.0–0.5)
HGB: 12.8 g/dL — ABNORMAL LOW (ref 13.0–17.1)
LYMPH%: 22.7 % (ref 14.0–49.0)
MCH: 29.8 pg (ref 27.2–33.4)
MCV: 87.1 fL (ref 79.3–98.0)
MONO%: 13.5 % (ref 0.0–14.0)
Platelets: 143 10*3/uL (ref 140–400)
RDW: 13.6 % (ref 11.0–14.6)

## 2011-06-21 MED ORDER — IOHEXOL 300 MG/ML  SOLN
100.0000 mL | Freq: Once | INTRAMUSCULAR | Status: AC | PRN
  Administered 2011-06-21: 100 mL via INTRAVENOUS

## 2011-07-17 ENCOUNTER — Encounter (HOSPITAL_BASED_OUTPATIENT_CLINIC_OR_DEPARTMENT_OTHER): Payer: Medicare Other | Admitting: Hematology and Oncology

## 2011-07-17 DIAGNOSIS — K639 Disease of intestine, unspecified: Secondary | ICD-10-CM

## 2011-07-17 DIAGNOSIS — C494 Malignant neoplasm of connective and soft tissue of abdomen: Secondary | ICD-10-CM

## 2011-08-13 LAB — BASIC METABOLIC PANEL
CO2: 29
Chloride: 106
GFR calc Af Amer: >60
Potassium: 3.7
Sodium: 140

## 2011-08-13 LAB — COMPREHENSIVE METABOLIC PANEL
AST: 17
Albumin: 3.3 — ABNORMAL LOW
BUN: 17
CO2: 27
Calcium: 8.9
Creatinine, Ser: 1
GFR calc Af Amer: >60
GFR calc non Af Amer: >60

## 2011-08-13 LAB — CARDIAC PANEL(CRET KIN+CKTOT+MB+TROPI)
CK, MB: 2.6
CK, MB: 2.7
Relative Index: INVALID
Total CK: 63
Troponin I: 0.04

## 2011-08-13 LAB — PROTIME-INR
INR: 1
Prothrombin Time: 13.7

## 2011-08-13 LAB — LIPID PANEL
Triglycerides: 60
VLDL: 12

## 2011-08-13 LAB — POCT CARDIAC MARKERS
CKMB, poc: 1.1
CKMB, poc: 1.1
Troponin i, poc: <0.05
Troponin i, poc: <0.05

## 2011-08-13 LAB — I-STAT 8, (EC8 V) (CONVERTED LAB)
Bicarbonate: 23.8
HCT: 43
Hemoglobin: 14.6
Operator id: 196461
pCO2, Ven: 34.5 — ABNORMAL LOW

## 2011-08-13 LAB — TSH: TSH: 1.087

## 2011-08-13 LAB — CK TOTAL AND CKMB (NOT AT ARMC)
CK, MB: 2.7
Relative Index: INVALID

## 2011-08-13 LAB — HEMOGLOBIN A1C: Hgb A1c MFr Bld: 6.9 — ABNORMAL HIGH

## 2011-08-13 LAB — TROPONIN I: Troponin I: 0.14 — ABNORMAL HIGH

## 2011-08-13 LAB — CALCIUM: Calcium: 8.7

## 2011-08-13 LAB — PHOSPHORUS: Phosphorus: 3.8

## 2011-11-18 ENCOUNTER — Encounter (HOSPITAL_COMMUNITY): Payer: Self-pay | Admitting: *Deleted

## 2011-11-18 ENCOUNTER — Emergency Department (HOSPITAL_COMMUNITY)
Admission: EM | Admit: 2011-11-18 | Discharge: 2011-11-18 | Disposition: A | Discharge status: Home / Self Care | Payer: Medicare Other | Attending: Emergency Medicine | Admitting: Emergency Medicine

## 2011-11-18 DIAGNOSIS — N390 Urinary tract infection, site not specified: Secondary | ICD-10-CM | POA: Insufficient documentation

## 2011-11-18 DIAGNOSIS — E119 Type 2 diabetes mellitus without complications: Secondary | ICD-10-CM | POA: Insufficient documentation

## 2011-11-18 DIAGNOSIS — Z8673 Personal history of transient ischemic attack (TIA), and cerebral infarction without residual deficits: Secondary | ICD-10-CM | POA: Insufficient documentation

## 2011-11-18 DIAGNOSIS — R10819 Abdominal tenderness, unspecified site: Secondary | ICD-10-CM | POA: Insufficient documentation

## 2011-11-18 DIAGNOSIS — R339 Retention of urine, unspecified: Secondary | ICD-10-CM | POA: Insufficient documentation

## 2011-11-18 LAB — CBC
MCH: 28.3 pg (ref 26.0–34.0)
MCV: 81.5 fL (ref 78.0–100.0)
Platelets: 186 10*3/uL (ref 150–400)
RBC: 4.48 MIL/uL (ref 4.22–5.81)

## 2011-11-18 LAB — DIFFERENTIAL
Eosinophils Absolute: 0 10*3/uL (ref 0.0–0.7)
Eosinophils Relative: 0 % (ref 0–5)
Lymphs Abs: 1.1 10*3/uL (ref 0.7–4.0)
Monocytes Relative: 11 % (ref 3–12)

## 2011-11-18 LAB — BASIC METABOLIC PANEL
BUN: 40 mg/dL — ABNORMAL HIGH (ref 6–23)
Calcium: 9.9 mg/dL (ref 8.4–10.5)
GFR calc non Af Amer: 32 mL/min — ABNORMAL LOW (ref >90)
Glucose, Bld: 165 mg/dL — ABNORMAL HIGH (ref 70–99)
Sodium: 134 mEq/L — ABNORMAL LOW (ref 135–145)

## 2011-11-18 LAB — URINE CULTURE
Colony Count: >=100000
Culture  Setup Time: 201301201343

## 2011-11-18 LAB — URINALYSIS, ROUTINE W REFLEX MICROSCOPIC
Bilirubin Urine: NEGATIVE
Ketones, ur: NEGATIVE mg/dL
Specific Gravity, Urine: 1.01 (ref 1.005–1.030)
Urobilinogen, UA: 0.2 mg/dL (ref 0.0–1.0)

## 2011-11-18 MED ORDER — AMOXICILLIN-POT CLAVULANATE 875-125 MG PO TABS: 1.0000 | ORAL_TABLET | Freq: Two times a day (BID) | ORAL | Status: AC

## 2011-11-18 MED ORDER — AMOXICILLIN-POT CLAVULANATE 875-125 MG PO TABS
1.0000 | ORAL_TABLET | Freq: Once | ORAL | Status: AC
  Administered 2011-11-18: 1 via ORAL
  Filled 2011-11-18: qty 1

## 2011-11-18 NOTE — ED Provider Notes (Signed)
History     CSN: [id number]  Arrival date & time 11/18/11  7829   First MD Initiated Contact with Patient 11/18/11 1003      Chief Complaint  Patient presents with  . Urinary Frequency    Burning and hurting    (Consider location/radiation/quality/duration/timing/severity/associated sxs/prior treatment) Patient is a 78 y.o. male presenting with frequency. The history is provided by the patient and a relative.  Urinary Frequency This is a new problem. The current episode started 1 to 4 weeks ago. The problem occurs constantly. The problem has been gradually worsening. Associated symptoms include abdominal pain and urinary symptoms. Pertinent negatives include no change in bowel habit, chills, fever, nausea, vomiting or weakness. The symptoms are aggravated by nothing.  Pt with urinary frequency and dysurina for about 3 weeks now. Was diagnosed with UTI and has had a course of two antibiotics. Per son, pt is getting worse. He wakes up multiple times at night to urinate. Complaining of abdominal pain. Unable to empty bladder. Pt Is followed by urology, dr. Lenn Sink. Pt denies fever, chills, nausea, vomiting, penile pain, flank pain.   Past Medical History  Diagnosis Date  . Blood in stool april 2011  . Hypertension   . IBS (irritable bowel syndrome)   . Glaucoma   . Stroke   . Chronic kidney disease   . Diabetes mellitus   . Wears dentures     No past surgical history on file.  Family History  Problem Relation Age of Onset  . Cancer Brother   . Heart disease Brother     History  Substance Use Topics  . Smoking status: Never Smoker   . Smokeless tobacco: Never Used  . Alcohol Use: No      Review of Systems  Constitutional: Negative for fever and chills.  HENT: Negative.   Eyes: Negative.   Respiratory: Negative.   Cardiovascular: Negative.   Gastrointestinal: Positive for abdominal pain and abdominal distention. Negative for nausea, vomiting, diarrhea and change  in bowel habit.  Genitourinary: Positive for dysuria, urgency and frequency. Negative for flank pain, scrotal swelling, penile pain and testicular pain.  Musculoskeletal: Negative for back pain.  Skin: Negative.   Neurological: Negative.  Negative for weakness.  Psychiatric/Behavioral: Negative.     Allergies  Codeine  Home Medications   Current Outpatient Rx  Name Route Sig Dispense Refill  . ALFUZOSIN HCL ER 10 MG PO TB24 Oral Take 10 mg by mouth daily.      . ASPIRIN EC 81 MG PO TBEC Oral Take 81 mg by mouth daily.    . DONEPEZIL HCL 10 MG PO TABS Oral Take 10 mg by mouth at bedtime.    Marland Kitchen DOXYCYCLINE HYCLATE 100 MG PO TABS Oral Take 100 mg by mouth 2 (two) times daily.    . DUTASTERIDE 0.5 MG PO CAPS Oral Take 0.5 mg by mouth daily.      Marland Kitchen FERROUS SULFATE 325 (65 FE) MG PO TABS Oral Take 325 mg by mouth 2 (two) times daily.     . OMEGA-3 FATTY ACIDS 1000 MG PO CAPS Oral Take 1 g by mouth daily.    Marland Kitchen LORATADINE 10 MG PO TABS Oral Take 10 mg by mouth daily.    Marland Kitchen METFORMIN HCL ER (MOD) 500 MG PO TB24 Oral Take 250 mg by mouth daily with breakfast.     . METOPROLOL SUCCINATE ER 25 MG PO TB24 Oral Take 25 mg by mouth daily.    Marland Kitchen MONTELUKAST  SODIUM 10 MG PO TABS Oral Take 10 mg by mouth at bedtime.    Marland Kitchen PRAVASTATIN SODIUM 20 MG PO TABS Oral Take 20 mg by mouth daily.      Marland Kitchen PRESCRIPTION MEDICATION Both Eyes Place 1 drop into both eyes daily. alrex    . VITAMIN B-12 100 MCG PO TABS Oral Take 50 mcg by mouth daily.    Marland Kitchen VITAMIN C 500 MG PO TABS Oral Take 500 mg by mouth daily.      BP 129/68  Pulse 85  Temp(Src) 97.5 F (36.4 C) (Oral)  Resp 19  SpO2 98%  Physical Exam  Nursing note reviewed. Constitutional: He is oriented to person, place, and time. He appears well-developed and well-nourished. No distress.  HENT:  Head: Atraumatic.  Eyes: Conjunctivae are normal.  Neck: Neck supple.  Cardiovascular: Normal rate, regular rhythm and normal heart sounds.   Pulmonary/Chest:  Effort normal and breath sounds normal. No respiratory distress.  Abdominal: Soft. Bowel sounds are increased.       Diffuse abdominal tenderness. Hyperactive bowel sounds.   Musculoskeletal: Normal range of motion. He exhibits no edema.  Neurological: He is alert and oriented to person, place, and time.  Skin: Skin is warm and dry. No rash noted.  Psychiatric: He has a normal mood and affect.    ED Course  Procedures (including critical care time)  12:11 PM Urine with TNTC WBCs. Pt now on 2nd antibiotics, currently taking doxycyclin. Post void residual obtained which showed 600cc in the bladder. Spoke with Dr. Laverle Patter, previous cultures from 12/31 sensitive to methicillin, advised to start pt on augmentin, place foley, follow up with Dr. Retta Diones next week. Discussed pt's bump in creatitine up to 1.8, pt does appear dehydrated and told me he is afraid of eating or drinking anything because he is tired of getting up all night to urinate. Dr. Laverle Patter stated to encourage him drinking fluids and will have his creatinine rechecked next week.    Results for orders placed during the hospital encounter of 11/18/11  URINALYSIS, ROUTINE W REFLEX MICROSCOPIC      Component Value Range   Color, Urine YELLOW  YELLOW    APPearance TURBID (*) CLEAR    Specific Gravity, Urine 1.010  1.005 - 1.030    pH 8.0  5.0 - 8.0    Glucose, UA NEGATIVE  NEGATIVE (mg/dL)   Hgb urine dipstick SMALL (*) NEGATIVE    Bilirubin Urine NEGATIVE  NEGATIVE    Ketones, ur NEGATIVE  NEGATIVE (mg/dL)   Protein, ur 161 (*) NEGATIVE (mg/dL)   Urobilinogen, UA 0.2  0.0 - 1.0 (mg/dL)   Nitrite NEGATIVE  NEGATIVE    Leukocytes, UA LARGE (*) NEGATIVE   CBC      Component Value Range   WBC 9.3  4.0 - 10.5 (K/uL)   RBC 4.48  4.22 - 5.81 (MIL/uL)   Hemoglobin 12.7 (*) 13.0 - 17.0 (g/dL)   HCT 09.6 (*) 04.5 - 52.0 (%)   MCV 81.5  78.0 - 100.0 (fL)   MCH 28.3  26.0 - 34.0 (pg)   MCHC 34.8  30.0 - 36.0 (g/dL)   RDW 40.9  81.1 -  91.4 (%)   Platelets 186  150 - 400 (K/uL)  DIFFERENTIAL      Component Value Range   Neutrophils Relative 77  43 - 77 (%)   Neutro Abs 7.2  1.7 - 7.7 (K/uL)   Lymphocytes Relative 11 (*) 12 - 46 (%)  Lymphs Abs 1.1  0.7 - 4.0 (K/uL)   Monocytes Relative 11  3 - 12 (%)   Monocytes Absolute 1.0  0.1 - 1.0 (K/uL)   Eosinophils Relative 0  0 - 5 (%)   Eosinophils Absolute 0.0  0.0 - 0.7 (K/uL)   Basophils Relative 0  0 - 1 (%)   Basophils Absolute 0.0  0.0 - 0.1 (K/uL)  BASIC METABOLIC PANEL      Component Value Range   Sodium 134 (*) 135 - 145 (mEq/L)   Potassium 4.4  3.5 - 5.1 (mEq/L)   Chloride 101  96 - 112 (mEq/L)   CO2 20  19 - 32 (mEq/L)   Glucose, Bld 165 (*) 70 - 99 (mg/dL)   BUN 40 (*) 6 - 23 (mg/dL)   Creatinine, Ser 4.09 (*) 0.50 - 1.35 (mg/dL)   Calcium 9.9  8.4 - 81.1 (mg/dL)   GFR calc non Af Amer 32 (*) >90 (mL/min)   GFR calc Af Amer 37 (*) >90 (mL/min)  URINE MICROSCOPIC-ADD ON      Component Value Range   WBC, UA TOO NUMEROUS TO COUNT  <3 (WBC/hpf)   Urine-Other MICROSCOPIC EXAM PERFORMED ON UNCONCENTRATED URINE     Foley was placed, 400cc of very cloudy urine in the foley bag. Will d/c home with the foley catheter and close follow up with urology. Pts vital signs are all within normal. He is feeling much better.  No diagnosis found.    MDM          Lottie Mussel, PA 11/18/11 1529

## 2011-11-18 NOTE — ED Notes (Signed)
Per pt's son/POA pt began having dysuria on 12/29, has received tx with worsening symptoms over the last several days.

## 2011-11-18 NOTE — ED Notes (Signed)
Patient attempted to use urinal.  Patient was unable to void.

## 2011-11-18 NOTE — ED Notes (Signed)
Leg bag applied by [facility], NT prior to DC home.

## 2011-11-18 NOTE — ED Notes (Signed)
Bladder scan performed showing about left in bladder after voiding, will inform Provider.

## 2011-11-18 NOTE — ED Notes (Signed)
PA at bedside.

## 2011-11-19 NOTE — ED Provider Notes (Signed)
Medical screening examination/treatment/procedure(s) were performed by non-physician practitioner and as supervising physician I was immediately available for consultation/collaboration.  Romello Hoehn T Fani Rotondo, MD 11/19/11 1543 

## 2011-11-21 NOTE — ED Notes (Signed)
+   Urine. Patient treated with Augmentin. Sensitive to same. Chart appended per protocol MD.

## 2011-11-24 ENCOUNTER — Encounter (HOSPITAL_COMMUNITY): Payer: Self-pay | Admitting: *Deleted

## 2011-12-03 ENCOUNTER — Telehealth: Payer: Self-pay | Admitting: Hematology and Oncology

## 2011-12-03 ENCOUNTER — Other Ambulatory Visit: Payer: Self-pay | Admitting: Hematology and Oncology

## 2011-12-03 DIAGNOSIS — C49A0 Gastrointestinal stromal tumor, unspecified site: Secondary | ICD-10-CM

## 2011-12-03 NOTE — Telephone Encounter (Signed)
S/w the pt's son and he is aware of the march 2013 appts along with the ct scan appt

## 2011-12-10 ENCOUNTER — Ambulatory Visit (INDEPENDENT_AMBULATORY_CARE_PROVIDER_SITE_OTHER): Payer: Medicare Other | Admitting: General Surgery

## 2011-12-10 ENCOUNTER — Encounter (INDEPENDENT_AMBULATORY_CARE_PROVIDER_SITE_OTHER): Payer: Self-pay | Admitting: General Surgery

## 2011-12-10 DIAGNOSIS — C494 Malignant neoplasm of connective and soft tissue of abdomen: Secondary | ICD-10-CM

## 2011-12-10 DIAGNOSIS — K432 Incisional hernia without obstruction or gangrene: Secondary | ICD-10-CM | POA: Insufficient documentation

## 2011-12-10 DIAGNOSIS — C49A0 Gastrointestinal stromal tumor, unspecified site: Secondary | ICD-10-CM | POA: Insufficient documentation

## 2011-12-10 NOTE — Assessment & Plan Note (Signed)
Hernia is large, unlikely to incarcerate. Reviewed symptoms of incarceration. Certainly we could repair this, but pt is completely asymptomatic and has multiple comorbidities along with limited functional status and advanced age. Will defer repair unless pt becomes symptomatic.

## 2011-12-10 NOTE — Patient Instructions (Signed)
Get CT scan in June.  Follow up with Dr. Dalene Carrow.  Let us know if persistent symptoms recur of nausea/vomiting, blood in stools, or abdominal pain.

## 2011-12-10 NOTE — Progress Notes (Signed)
HISTORY: Pt is doing fairly well.  He does have frequent constipation, but this is improved with stool softeners and laxatives.  He denies bloody bowel movements, abdominal pain, nausea or vomiting.  He denies weight loss.  He is frequently weak and confused.  He has difficulty urinating, and has had to have a foley placed on multiple occasions. He suffers from severe hearing loss.     PERTINENT REVIEW OF SYSTEMS: Otherwise negative other than HPI except for nasal congestion and joint pain.    EXAM: Head: Normocephalic and atraumatic, pt wearing hearing aid.   Eyes:  Conjunctivae are normal. Pupils are equal, round, and reactive to light. No scleral icterus.  Neck:  Normal range of motion. Neck supple. No tracheal deviation present. No thyromegaly present.  Heart:  No murmurs, palpable distal pulses.   Resp: No respiratory distress, normal effort.  No wheezing, rales or rhonchi. Abd:  Abdomen is soft, non distended and non tender. No masses are palpable.  There is no rebound and no guarding. There is a large, soft, ventral hernia that is non tender.   Neurological: Alert and oriented to person, place, and time. Coordination normal. Pt is VERY hard of hearing. Able to get on exam table. Skin: Skin is warm and dry. No rash noted. No diaphoretic. No erythema. No pallor.  Psychiatric: Normal mood and affect. Normal behavior. Judgment and thought content normal.     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  No clinical evidence of disease.   Pt scheduled for CT scan in June.    Is not on Gleevec due to limited functional status and concern of side effects.    Incisional hernia, asymptomatic Hernia is large, unlikely to incarcerate. Reviewed symptoms of incarceration. Certainly we could repair this, but pt is completely asymptomatic and has multiple comorbidities along with limited functional status and  advanced age. Will defer repair unless pt becomes symptomatic.         Maudry Diego, MD Surgical Oncology, General & Endocrine Surgery [facility], Hubbard Hartshorn, MD, MD No ref. provider found

## 2011-12-10 NOTE — Assessment & Plan Note (Addendum)
Pt doing well.  No clinical evidence of disease.   Pt scheduled for CT scan in June.    Is not on Gleevec due to limited functional status and concern of side effects.

## 2011-12-25 ENCOUNTER — Other Ambulatory Visit: Payer: Self-pay | Admitting: Surgery

## 2012-01-17 HISTORY — PX: US ECHOCARDIOGRAPHY: HXRAD669

## 2012-04-08 ENCOUNTER — Encounter (INDEPENDENT_AMBULATORY_CARE_PROVIDER_SITE_OTHER): Payer: Self-pay | Admitting: General Surgery

## 2012-04-18 ENCOUNTER — Ambulatory Visit (HOSPITAL_COMMUNITY)
Admission: RE | Admit: 2012-04-18 | Discharge: 2012-04-18 | Disposition: A | Discharge status: Home / Self Care | Payer: Medicare Other | Source: Ambulatory Visit | Attending: Hematology and Oncology | Admitting: Hematology and Oncology

## 2012-04-18 ENCOUNTER — Other Ambulatory Visit (HOSPITAL_BASED_OUTPATIENT_CLINIC_OR_DEPARTMENT_OTHER): Payer: Medicare Other | Admitting: Lab

## 2012-04-18 DIAGNOSIS — C494 Malignant neoplasm of connective and soft tissue of abdomen: Secondary | ICD-10-CM

## 2012-04-18 DIAGNOSIS — C49A0 Gastrointestinal stromal tumor, unspecified site: Secondary | ICD-10-CM

## 2012-04-18 DIAGNOSIS — K7689 Other specified diseases of liver: Secondary | ICD-10-CM | POA: Insufficient documentation

## 2012-04-18 LAB — CMP (CANCER CENTER ONLY)
ALT(SGPT): 19 U/L (ref 10–47)
AST: 26 U/L (ref 11–38)
CO2: 29 mEq/L (ref 18–33)
Creat: 1.2 mg/dl (ref 0.6–1.2)
Total Bilirubin: 0.8 mg/dl (ref 0.20–1.60)

## 2012-04-18 LAB — LACTATE DEHYDROGENASE: LDH: 146 U/L (ref 94–250)

## 2012-04-18 LAB — CBC WITH DIFFERENTIAL/PLATELET
BASO%: 0.8 % (ref 0.0–2.0)
EOS%: 2 % (ref 0.0–7.0)
HCT: 40.7 % (ref 38.4–49.9)
LYMPH%: 39.4 % (ref 14.0–49.0)
MCH: 28.8 pg (ref 27.2–33.4)
MCHC: 33.5 g/dL (ref 32.0–36.0)
NEUT%: 41.3 % (ref 39.0–75.0)
Platelets: 127 10*3/uL — ABNORMAL LOW (ref 140–400)

## 2012-04-18 MED ORDER — IOHEXOL 300 MG/ML  SOLN
100.0000 mL | Freq: Once | INTRAMUSCULAR | Status: AC | PRN
  Administered 2012-04-18: 100 mL via INTRAVENOUS

## 2012-04-22 ENCOUNTER — Encounter: Payer: Self-pay | Admitting: Hematology and Oncology

## 2012-04-22 ENCOUNTER — Ambulatory Visit (HOSPITAL_BASED_OUTPATIENT_CLINIC_OR_DEPARTMENT_OTHER): Payer: Medicare Other | Admitting: Hematology and Oncology

## 2012-04-22 ENCOUNTER — Other Ambulatory Visit: Payer: Self-pay | Admitting: Hematology and Oncology

## 2012-04-22 ENCOUNTER — Telehealth: Payer: Self-pay | Admitting: Hematology and Oncology

## 2012-04-22 VITALS — BP 160/73 | HR 69 | Temp 98.0°F | Ht 71.0 in | Wt 180.3 lb

## 2012-04-22 DIAGNOSIS — K7689 Other specified diseases of liver: Secondary | ICD-10-CM

## 2012-04-22 DIAGNOSIS — C49A0 Gastrointestinal stromal tumor, unspecified site: Secondary | ICD-10-CM

## 2012-04-22 DIAGNOSIS — K469 Unspecified abdominal hernia without obstruction or gangrene: Secondary | ICD-10-CM

## 2012-04-22 DIAGNOSIS — C494 Malignant neoplasm of connective and soft tissue of abdomen: Secondary | ICD-10-CM

## 2012-04-22 NOTE — Telephone Encounter (Signed)
S/w alisha @ central scheduling. bx has been put in for review and central will contact pt re appt. Pt/relative aware. No other orders.

## 2012-04-22 NOTE — Progress Notes (Signed)
CC:   Massie Maroon, MD Almond Lint, MD Richard A. Alanda Amass, M.D. Maretta Bees. Vonita Moss, M.D.  IDENTIFYING STATEMENT:  Patient is an 78 year old man with GIST tumor who presents for followup.  INTERVAL HISTORY:  Mr. Gleason who is here with his son reports no unusual concerns.  He has a nontender hernia.  He is moving his bowels. He has not lost any weight.  He denies anorexia.  We reviewed CT scans of the chest, abdomen, and pelvis on 04/18/2012.  There were no CT findings for metastatic disease.  The abdomen and pelvis revealed a new 17-mm left hepatic lobe lesion situated anteriorly which was worrisome for metastases.  There were other stable, scattered, low-attenuation lesions which were felt to be benign cysts.  There was no retroperitoneal or pelvic adenopathy.  The spleen was not enlarged.  MEDICATIONS:  Reviewed and updated.  ALLERGIES:  Codeine.  PAST MEDICAL HISTORY/FAMILY HISTORY/SOCIAL HISTORY:  Unchanged.  REVIEW OF SYSTEMS:  Ten-point review of systems negative.  PHYSICAL EXAM:  General: The patient is an older man in no distress. Vitals: Pulse 69, blood pressure 160/73, temperature 98, respirations 18, weight 180.3 pounds.  HEENT:  Head is atraumatic, normocephalic. Sclerae anicteric.  Mouth moist.  Neck:  Supple.  Chest:  Clear to percussion and auscultation.  Abdomen:  Notes a hernia reducible nontender.  No hepatomegaly.  Bowel sounds present.  Extremities:  No edema.  CNS:  Nonfocal.  LAB DATA:  04/18/2012 white cell count 4.1, hemoglobin 13.7, hematocrit 40.7, platelets 127.  Sodium 138, potassium 4.5, chloride 97, CO2 29, BUN 18, creatinine 1.2, glucose 160, total bilirubin 0.8, alkaline phosphatase 74, AST 26, ALT 19, calcium 9.  Results of CT as above.  IMPRESSION AND PLAN:  Mr. Caine is an 78 year old man who is status post small bowel resection, and partial colectomy on June 01, 2010 for a 5.3 cm high-grade GI stromal tumor.  Most recent CTs note a new  17 mm left hepatic lobe lesion.  The patient is agreeable to proceed for a CT- guided biopsy to obtain a histological diagnosis.  He tells me that he does not necessarily think that he will want to have anything done, but he will be at ease with knowing what's going on.  I will have IR perform a CT-guided biopsy.  We will inform the patient.  I also understand that the patient has a followup visit with Dr. Donell Beers next month.    ______________________________ Laurice Record, M.D. LIO/MEDQ  D:  04/22/2012  T:  04/22/2012  Job:  [id number]

## 2012-04-22 NOTE — Progress Notes (Signed)
This office note has been dictated.

## 2012-04-22 NOTE — Patient Instructions (Signed)
Phillip Chapman  [id number]  [facility] Discharge Instructions  RECOMMENDATIONS MADE BY THE CONSULTANT AND ANY TEST RESULTS WILL BE SENT TO YOUR REFERRING DOCTOR.   EXAM FINDINGS BY MD TODAY AND SIGNS AND SYMPTOMS TO REPORT TO CLINIC OR PRIMARY MD:   Your current list of medications are: Current Outpatient Prescriptions  Medication Sig Dispense Refill  . alfuzosin (UROXATRAL) 10 MG 24 hr tablet Take 10 mg by mouth daily.        Marland Kitchen aspirin EC 81 MG tablet Take 81 mg by mouth daily.      Marland Kitchen donepezil (ARICEPT) 10 MG tablet Take 10 mg by mouth at bedtime.      . dutasteride (AVODART) 0.5 MG capsule Take 0.5 mg by mouth daily.        . ferrous sulfate 325 (65 FE) MG tablet Take 325 mg by mouth 2 (two) times daily.       . fish oil-omega-3 fatty acids 1000 MG capsule Take 1 g by mouth daily.      Marland Kitchen loratadine (CLARITIN) 10 MG tablet Take 10 mg by mouth daily.      . metFORMIN (GLUMETZA) 500 MG (MOD) 24 hr tablet Take 250 mg by mouth daily with breakfast.       . metoprolol succinate (TOPROL-XL) 25 MG 24 hr tablet Take 25 mg by mouth daily.      . montelukast (SINGULAIR) 10 MG tablet Take 10 mg by mouth at bedtime.      . pravastatin (PRAVACHOL) 20 MG tablet Take 20 mg by mouth daily.        Marland Kitchen PRESCRIPTION MEDICATION Place 1 drop into both eyes daily. alrex      . vitamin B-12 (CYANOCOBALAMIN) 100 MCG tablet Take 50 mcg by mouth daily.      . vitamin C (ASCORBIC ACID) 500 MG tablet Take 500 mg by mouth daily.         INSTRUCTIONS GIVEN AND DISCUSSED:   SPECIAL INSTRUCTIONS/FOLLOW-UP:  See above.  I acknowledge that I have been informed and understand all the instructions given to me and received a copy. I do not have any more questions at this time, but understand that I may call the [facility] at [phone] during business hours should I have any further questions or need assistance in obtaining follow-up care.

## 2012-05-02 ENCOUNTER — Other Ambulatory Visit: Payer: Self-pay | Admitting: Radiology

## 2012-05-05 ENCOUNTER — Ambulatory Visit (HOSPITAL_COMMUNITY)
Admission: RE | Admit: 2012-05-05 | Discharge: 2012-05-05 | Disposition: A | Discharge status: Home / Self Care | Payer: Medicare Other | Source: Ambulatory Visit | Attending: Hematology and Oncology | Admitting: Hematology and Oncology

## 2012-05-05 ENCOUNTER — Encounter (HOSPITAL_COMMUNITY): Payer: Self-pay

## 2012-05-05 ENCOUNTER — Inpatient Hospital Stay (HOSPITAL_COMMUNITY): Admission: RE | Admit: 2012-05-05 | Payer: Medicare Other | Source: Ambulatory Visit

## 2012-05-05 DIAGNOSIS — N189 Chronic kidney disease, unspecified: Secondary | ICD-10-CM | POA: Insufficient documentation

## 2012-05-05 DIAGNOSIS — M810 Age-related osteoporosis without current pathological fracture: Secondary | ICD-10-CM | POA: Insufficient documentation

## 2012-05-05 DIAGNOSIS — E119 Type 2 diabetes mellitus without complications: Secondary | ICD-10-CM | POA: Insufficient documentation

## 2012-05-05 DIAGNOSIS — C49A0 Gastrointestinal stromal tumor, unspecified site: Secondary | ICD-10-CM

## 2012-05-05 DIAGNOSIS — I129 Hypertensive chronic kidney disease with stage 1 through stage 4 chronic kidney disease, or unspecified chronic kidney disease: Secondary | ICD-10-CM | POA: Insufficient documentation

## 2012-05-05 DIAGNOSIS — C494 Malignant neoplasm of connective and soft tissue of abdomen: Secondary | ICD-10-CM | POA: Insufficient documentation

## 2012-05-05 DIAGNOSIS — K7689 Other specified diseases of liver: Secondary | ICD-10-CM | POA: Insufficient documentation

## 2012-05-05 LAB — CBC
Hemoglobin: 13.5 g/dL (ref 13.0–17.0)
MCH: 28.8 pg (ref 26.0–34.0)
MCHC: 34.7 g/dL (ref 30.0–36.0)
MCV: 83.1 fL (ref 78.0–100.0)

## 2012-05-05 LAB — PROTIME-INR: Prothrombin Time: 13 seconds (ref 11.6–15.2)

## 2012-05-05 LAB — GLUCOSE, CAPILLARY

## 2012-05-05 MED ORDER — MIDAZOLAM HCL 5 MG/5ML IJ SOLN
INTRAMUSCULAR | Status: AC | PRN
  Administered 2012-05-05 (×3): 1 mg via INTRAVENOUS

## 2012-05-05 MED ORDER — FENTANYL CITRATE 0.05 MG/ML IJ SOLN
INTRAMUSCULAR | Status: AC
  Filled 2012-05-05: qty 6

## 2012-05-05 MED ORDER — MIDAZOLAM HCL 2 MG/2ML IJ SOLN
INTRAMUSCULAR | Status: AC
  Filled 2012-05-05: qty 6

## 2012-05-05 MED ORDER — FENTANYL CITRATE 0.05 MG/ML IJ SOLN
INTRAMUSCULAR | Status: AC | PRN
  Administered 2012-05-05 (×3): 50 ug via INTRAVENOUS

## 2012-05-05 MED ORDER — SODIUM CHLORIDE 0.9 % IV SOLN
Freq: Once | INTRAVENOUS | Status: AC
  Administered 2012-05-05: 12:00:00 via INTRAVENOUS

## 2012-05-05 NOTE — Procedures (Signed)
US guided biopsy of new hepatic lesion.   US guidance was very difficult 2/2 deep respirations and poor acoustic window.  If non diagnostic, may require CT guidance to obtain dx sample.   No immediate complication.

## 2012-05-05 NOTE — H&P (Signed)
Chief Complaint: "I'm here for liver biopsy" Referring Physician:Odogwu HPI: Phillip Chapman is an 78 y.o. male with hx of malignant GIST requiring partial SBR/colectomy. He has been followed up and his most recent CT finds a new lesion in the liver concerning for metastasis. He is scheduled for US guided liver lesion biopsy. He has otherwise been ok.   Past Medical History:  Past Medical History  Diagnosis Date  . Blood in stool april 2011  . Hypertension   . IBS (irritable bowel syndrome)   . Glaucoma   . Stroke   . Chronic kidney disease   . Diabetes mellitus   . Wears dentures   . Arthritis   . Hyperlipidemia   . Osteoporosis   . Hearing loss   . Nasal congestion   . Difficulty urinating   . Arthritis pain   . Weakness   . Confusion   . Cancer     colon    Past Surgical History:  Past Surgical History  Procedure Date  . Hernia repair   . Retinal detachment surgery 2010  . Colon surgery 2010  . Leg surgery     due to saw mill accident    Family History:  Family History  Problem Relation Age of Onset  . Cancer Brother   . Heart disease Brother     Social History:  reports that he quit smoking about 30 years ago. He has never used smokeless tobacco. He reports that he does not drink alcohol or use illicit drugs.  Allergies:  Allergies  Allergen Reactions  . Codeine Nausea And Vomiting  . 3p Nol (Acetaminophen)     Medications: Uroxatral 10mg  daily ASA 81mg  daily-held since 7/6 Aricept 10mg  daily Avodart 0.5mg  daily FeSO4 daily Toprol XL 25mg  daily Metformin 500mg  daily Singulair 10mg  daily Pravachol 20mg  daily Vit C daily  ROS: Review of Systems - History obtained from stepson who is POA ENT ROS: negative Respiratory ROS: no cough, shortness of breath, or wheezing Cardiovascular ROS: no chest pain or dyspnea on exertion Gastrointestinal ROS: no abdominal pain, change in bowel habits, or black or bloody stools Genito-Urinary ROS: no dysuria,  trouble voiding, or hematuria Musculoskeletal ROS: negative Neurological ROS: no TIA or stroke symptoms   Physical Exam: Blood pressure 177/71, pulse 76, temperature 99.1 F (37.3 C), temperature source Oral, resp. rate 20, height 5\' 11"  (1.803 m), weight 180 lb (81.647 kg), SpO2 100.00%. Body mass index is 25.10 kg/(m^2).   General Appearance:  Alert, cooperative, no distress, appears stated age  Head:  Normocephalic, without obvious abnormality, atraumatic  ENT: Unremarkable  Neck: Supple, symmetrical, trachea midline, no adenopathy, thyroid: not enlarged, symmetric, no tenderness/mass/nodules  Lungs:   Clear to auscultation bilaterally, no w/r/r, respirations unlabored without use of accessory muscles.  Chest Wall:  No tenderness or deformity  Heart:  Regular rate and rhythm, S1, S2 normal, no murmur, rub or gallop. Carotids 2+ without bruit.  Abdomen:   Soft, non-tender, non distended. Well healed midline scar with small reducible incisional hernia. No RUQ/epigastric defects  Extremities: Extremities normal, atraumatic, no cyanosis or edema  Neurologic: Normal affect, no gross deficits.   Results for orders placed during the hospital encounter of 05/05/12 (from the past 48 hour(s))  APTT     Status: Normal   Collection Time   05/05/12 11:57 AM      Component Value Range Comment   aPTT 29  24 - 37 seconds   CBC     Status: Abnormal  Collection Time   05/05/12 11:57 AM      Component Value Range Comment   WBC 4.1  4.0 - 10.5 K/uL    RBC 4.68  4.22 - 5.81 MIL/uL    Hemoglobin 13.5  13.0 - 17.0 g/dL    HCT 16.1 (*) 09.6 - 52.0 %    MCV 83.1  78.0 - 100.0 fL    MCH 28.8  26.0 - 34.0 pg    MCHC 34.7  30.0 - 36.0 g/dL    RDW 04.5  40.9 - 81.1 %    Platelets 129 (*) 150 - 400 K/uL   PROTIME-INR     Status: Normal   Collection Time   05/05/12 11:57 AM      Component Value Range Comment   Prothrombin Time 13.0  11.6 - 15.2 seconds    INR 0.96  0.00 - 1.49    No results  found.  Assessment/Plan Hx of malignant GIST New liver mass Labs ok. Discussed procedure including risks and complications with pt and stepson, who is POA. Consent signed in chart.  Brayton El PA-C 05/05/2012, 12:36 PM

## 2012-05-07 ENCOUNTER — Other Ambulatory Visit: Payer: Self-pay | Admitting: *Deleted

## 2012-05-07 DIAGNOSIS — C49A0 Gastrointestinal stromal tumor, unspecified site: Secondary | ICD-10-CM

## 2012-05-09 ENCOUNTER — Other Ambulatory Visit: Payer: Self-pay | Admitting: Radiology

## 2012-05-12 ENCOUNTER — Ambulatory Visit (HOSPITAL_COMMUNITY): Payer: Medicare Other

## 2012-05-12 ENCOUNTER — Other Ambulatory Visit (HOSPITAL_COMMUNITY): Payer: Medicare Other

## 2012-05-15 ENCOUNTER — Ambulatory Visit (HOSPITAL_COMMUNITY)
Admission: RE | Admit: 2012-05-15 | Discharge: 2012-05-15 | Disposition: A | Discharge status: Home / Self Care | Payer: Medicare Other | Source: Ambulatory Visit | Attending: Hematology and Oncology | Admitting: Hematology and Oncology

## 2012-05-15 ENCOUNTER — Encounter (HOSPITAL_COMMUNITY): Payer: Self-pay

## 2012-05-15 DIAGNOSIS — C787 Secondary malignant neoplasm of liver and intrahepatic bile duct: Secondary | ICD-10-CM

## 2012-05-15 DIAGNOSIS — C49A0 Gastrointestinal stromal tumor, unspecified site: Secondary | ICD-10-CM

## 2012-05-15 DIAGNOSIS — C494 Malignant neoplasm of connective and soft tissue of abdomen: Secondary | ICD-10-CM | POA: Insufficient documentation

## 2012-05-15 HISTORY — DX: Secondary malignant neoplasm of liver and intrahepatic bile duct: C78.7

## 2012-05-15 LAB — CBC
HCT: 39 % (ref 39.0–52.0)
MCHC: 35.1 g/dL (ref 30.0–36.0)
MCV: 83.5 fL (ref 78.0–100.0)
RDW: 13.2 % (ref 11.5–15.5)

## 2012-05-15 MED ORDER — MIDAZOLAM HCL 5 MG/5ML IJ SOLN
INTRAMUSCULAR | Status: AC | PRN
  Administered 2012-05-15 (×2): 1 mg via INTRAVENOUS

## 2012-05-15 MED ORDER — SODIUM CHLORIDE 0.9 % IV SOLN
Freq: Once | INTRAVENOUS | Status: AC
  Administered 2012-05-15: 08:00:00 via INTRAVENOUS

## 2012-05-15 MED ORDER — FENTANYL CITRATE 0.05 MG/ML IJ SOLN
INTRAMUSCULAR | Status: AC | PRN
  Administered 2012-05-15 (×2): 50 ug via INTRAVENOUS

## 2012-05-15 MED ORDER — MIDAZOLAM HCL 2 MG/2ML IJ SOLN
INTRAMUSCULAR | Status: AC
  Filled 2012-05-15: qty 4

## 2012-05-15 MED ORDER — FENTANYL CITRATE 0.05 MG/ML IJ SOLN
INTRAMUSCULAR | Status: AC
  Filled 2012-05-15: qty 4

## 2012-05-15 MED ORDER — HYDROCODONE-ACETAMINOPHEN 5-325 MG PO TABS
1.0000 | ORAL_TABLET | ORAL | Status: DC | PRN
  Filled 2012-05-15: qty 2

## 2012-05-15 NOTE — H&P (Signed)
Phillip Chapman is an 78 y.o. male.   Chief Complaint: liver mass HPI: Patient with history of malignant GIST and recent non diagnostic US guided liver mass biopsy presents today for repeat liver mass biopsy via CT guidance.  Past Medical History  Diagnosis Date  . Blood in stool april 2011  . Hypertension   . IBS (irritable bowel syndrome)   . Glaucoma   . Stroke   . Chronic kidney disease   . Diabetes mellitus   . Wears dentures   . Arthritis   . Hyperlipidemia   . Osteoporosis   . Hearing loss   . Nasal congestion   . Difficulty urinating   . Arthritis pain   . Weakness   . Confusion   . Cancer     colon    Past Surgical History  Procedure Date  . Hernia repair   . Retinal detachment surgery 2010  . Colon surgery 2010  . Leg surgery     due to saw mill accident    Family History  Problem Relation Age of Onset  . Cancer Brother   . Heart disease Brother    Social History:  reports that he quit smoking about 30 years ago. He has never used smokeless tobacco. He reports that he does not drink alcohol or use illicit drugs.  Allergies:  Allergies  Allergen Reactions  . Codeine Nausea And Vomiting  . 3p Nol (Acetaminophen)     Current outpatient prescriptions:alfuzosin (UROXATRAL) 10 MG 24 hr tablet, Take 10 mg by mouth daily.  , Disp: , Rfl: ;  aspirin EC 81 MG tablet, Take 81 mg by mouth daily., Disp: , Rfl: ;  b complex vitamins capsule, Take 1 capsule by mouth daily., Disp: , Rfl: ;  donepezil (ARICEPT) 10 MG tablet, Take 10 mg by mouth at bedtime., Disp: , Rfl: ;  dutasteride (AVODART) 0.5 MG capsule, Take 0.5 mg by mouth daily.  , Disp: , Rfl:  ferrous sulfate 325 (65 FE) MG tablet, Take 325 mg by mouth 2 (two) times daily. , Disp: , Rfl: ;  fish oil-omega-3 fatty acids 1000 MG capsule, Take 1 g by mouth daily., Disp: , Rfl: ;  loratadine (CLARITIN) 10 MG tablet, Take 10 mg by mouth daily., Disp: , Rfl: ;  metFORMIN (GLUMETZA) 500 MG (MOD) 24 hr tablet, Take 500  mg by mouth daily with breakfast. , Disp: , Rfl:  metoprolol succinate (TOPROL-XL) 25 MG 24 hr tablet, Take 25 mg by mouth daily., Disp: , Rfl: ;  montelukast (SINGULAIR) 10 MG tablet, Take 10 mg by mouth at bedtime., Disp: , Rfl: ;  pravastatin (PRAVACHOL) 20 MG tablet, Take 20 mg by mouth daily.  , Disp: , Rfl: ;  PRESCRIPTION MEDICATION, Place 1 drop into both eyes as needed. alrex, Disp: , Rfl: ;  vitamin C (ASCORBIC ACID) 500 MG tablet, Take 500 mg by mouth daily., Disp: , Rfl:  Current facility-administered medications:0.9 %  sodium chloride infusion, , Intravenous, Once, Brayton El, PA, Last Rate: 20 mL/hr at 05/15/12 0813   Results for orders placed during the hospital encounter of 05/15/12 (from the past 48 hour(s))  CBC     Status: Abnormal   Collection Time   05/15/12  8:05 AM      Component Value Range Comment   WBC 5.1  4.0 - 10.5 K/uL    RBC 4.67  4.22 - 5.81 MIL/uL    Hemoglobin 13.7  13.0 - 17.0 g/dL  HCT 39.0  39.0 - 52.0 %    MCV 83.5  78.0 - 100.0 fL    MCH 29.3  26.0 - 34.0 pg    MCHC 35.1  30.0 - 36.0 g/dL    RDW 16.1  09.6 - 04.5 %    Platelets 122 (*) 150 - 400 K/uL    Results for orders placed during the hospital encounter of 05/15/12  CBC      Component Value Range   WBC 5.1  4.0 - 10.5 K/uL   RBC 4.67  4.22 - 5.81 MIL/uL   Hemoglobin 13.7  13.0 - 17.0 g/dL   HCT 40.9  81.1 - 91.4 %   MCV 83.5  78.0 - 100.0 fL   MCH 29.3  26.0 - 34.0 pg   MCHC 35.1  30.0 - 36.0 g/dL   RDW 78.2  95.6 - 21.3 %   Platelets 122 (*) 150 - 400 K/uL   PT 13.0  INR  0.96 Review of Systems  Constitutional: Negative for fever and chills.  HENT: Positive for hearing loss.   Respiratory: Negative for cough and shortness of breath.   Cardiovascular: Negative for chest pain.  Gastrointestinal: Negative for nausea, vomiting and abdominal pain.  Musculoskeletal: Negative for back pain.  Neurological: Negative for headaches.    Blood pressure 167/72, pulse 68, temperature 98 F  (36.7 C), temperature source Oral, resp. rate 18, height 5\' 11"  (1.803 m), weight 180 lb (81.647 kg), SpO2 99.00%. Physical Exam  Constitutional: He appears well-developed and well-nourished.  Cardiovascular: Normal rate and regular rhythm.   Respiratory: Effort normal and breath sounds normal.  GI: Soft. Bowel sounds are normal.       Small amount of ecchymosis upper abdomen  Musculoskeletal: Normal range of motion. He exhibits no edema.  Neurological:       Normal affect, no gross deficits     Assessment/Plan Patient with malignant GIST and new liver mass; plan is for repeat attempt at liver mass biopsy via CT guidance. Details/risks of procedure d/w pt/stepson with their understanding and consent.  ALLRED,D KEVIN 05/15/2012, 8:26 AM

## 2012-05-15 NOTE — H&P (Signed)
Agree with above.  Pt has undergone an unsuccessful attempt at attaining tissue diagnosis via US guidance.  We will proceed with CT guided approach.  Signed,  Sterling Big, MD Vascular & Interventional Radiologist [facility] Radiology

## 2012-05-15 NOTE — Procedures (Signed)
Interventional Radiology Procedure Note  Procedure: CT guided FNA and core biopsy of liver lesion Complications: No immediate Recommendations:  - Bedrest x 3 hrs, then advance to ambulation - ADAT  Signed,  Sterling Big, MD Vascular & Interventional Radiologist [facility] Radiology

## 2012-06-02 ENCOUNTER — Telehealth: Payer: Self-pay | Admitting: *Deleted

## 2012-06-02 NOTE — Telephone Encounter (Signed)
Received call from stepson [name] ( on [employer]IP[employer] ) wanting to know of f/u appt with Dr. Dalene Carrow.   Informed [name] that md was not in office today.    Per [name], pt was informed by md that pt will have f/u appt with Dr. Dalene Carrow after biopsy procedure to discuss results prior to pt seeing the surgeon. Pt has appt with surgeon Dr. Donell Beers on 06/13/12.    Message to md for 06/03/12. [name]'s  Phone     [phone].

## 2012-06-03 ENCOUNTER — Telehealth: Payer: Self-pay | Admitting: *Deleted

## 2012-06-03 NOTE — Telephone Encounter (Signed)
Attempted to call stepson  [name]  X 3  On cell phone  Unsucessfully.    Left messages on voice mail for [name] to call nurse back .

## 2012-06-13 ENCOUNTER — Encounter (INDEPENDENT_AMBULATORY_CARE_PROVIDER_SITE_OTHER): Payer: Self-pay | Admitting: General Surgery

## 2012-06-13 ENCOUNTER — Ambulatory Visit (INDEPENDENT_AMBULATORY_CARE_PROVIDER_SITE_OTHER): Payer: Medicare Other | Admitting: General Surgery

## 2012-06-13 VITALS — BP 146/88 | HR 68 | Temp 97.3°F | Resp 16 | Ht 71.0 in | Wt 178.4 lb

## 2012-06-13 DIAGNOSIS — C49A0 Gastrointestinal stromal tumor, unspecified site: Secondary | ICD-10-CM

## 2012-06-13 DIAGNOSIS — C494 Malignant neoplasm of connective and soft tissue of abdomen: Secondary | ICD-10-CM

## 2012-06-13 NOTE — Assessment & Plan Note (Signed)
Patient continues to have a large ventral hernia. This is asymptomatic. At this point, I think the risks of repair are higher than the risks of nonoperative management. I advised the patient and his son about the symptoms of incarceration. I offered the patient abdominal binder for comfort. He did not want one.  The patient also has a recurrent Gist in the liver. This appears to be approximately 17 mm. This was biopsied and was positive. He has followup with Dr. Dalene Carrow to discuss whether or not to treat this. I will see him back in a year unless he has symptoms that require otherwise.  I advised the patient that we are able to fix the hernia and that if I did fix the hernia I would remove the tumor in the liver. It is very superficial and can be removed with a wedge resection.  He is going to keep an eye on his symptoms.

## 2012-06-13 NOTE — Progress Notes (Signed)
HISTORY: Patient is an 78 year old male who is status post resection of small bowel and colon for an obstructing GI stromal tumor in 2011.  He had a prolonged recovery period. He subsequently has developed a large ventral hernia. This is asymptomatic. He denies pain, nausea, vomiting, obstruction. He is doing okay although still has issues getting around at his age. He did develop a recurrence of his GI stromal tumor in his liver detected in January. This was biopsied in July and was positive. He is not sure if he is going to do anything about this. He is going to follow with Dr. Dalene Carrow to fully discuss this.   PERTINENT REVIEW OF SYSTEMS: Otherwise negative.     EXAM: Head: Normocephalic and atraumatic.  Eyes:  Conjunctivae are normal. Pupils are equal, round, and reactive to light. No scleral icterus.  Neck:  Normal range of motion. Neck supple. No tracheal deviation present. No thyromegaly present.  Resp: No respiratory distress, normal effort. Abd:  Abdomen is soft, non distended and non tender. No masses are palpable.  There is no rebound and no guarding.  Large ventral hernia, reducible.   Neurological: Alert and oriented to person, place, and time. Coordination normal.  Skin: Skin is warm and dry. No rash noted. No diaphoretic. No erythema. No pallor.  Psychiatric: Normal mood and affect. Normal behavior. Judgment and thought content normal.      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 This is asymptomatic. At this point, I think the risks of repair are higher than the risks of nonoperative management. I advised the patient and his son about the symptoms of incarceration. I offered the patient abdominal binder for comfort. He did not want one.  The patient also has a recurrent Gist in the liver. This appears to be approximately 17 mm. This was biopsied and was  positive. He has followup with Dr. Dalene Carrow to discuss whether or not to treat this. I will see him back in a year unless he has symptoms that require otherwise.  I advised the patient that we are able to fix the hernia and that if I did fix the hernia I would remove the tumor in the liver. It is very superficial and can be removed with a wedge resection.  He is going to keep an eye on his symptoms.      Maudry Diego, MD Surgical Oncology, General & Endocrine Surgery [facility] Surgery, Hubbard Hartshorn, MD Massie Maroon., MD

## 2012-06-13 NOTE — Patient Instructions (Addendum)
Call if hernia becomes painful, or if you develop nausea/vomiting in conjunction with firm hernia and pain.  We can repair hernia, but it would be a big surgery and would be painful.  I will leave decision to treat recurrent GIST up to you and Dr. Dalene Carrow and her discussions.  If I were to do a hernia repair, I would remove this.

## 2012-06-18 ENCOUNTER — Other Ambulatory Visit: Payer: Self-pay | Admitting: Hematology and Oncology

## 2012-06-18 ENCOUNTER — Telehealth: Payer: Self-pay | Admitting: Nurse Practitioner

## 2012-06-18 DIAGNOSIS — C49A0 Gastrointestinal stromal tumor, unspecified site: Secondary | ICD-10-CM

## 2012-06-18 NOTE — Telephone Encounter (Signed)
Message copied by Barbara Cower on Wed Jun 18, 2012  2:35 PM ------      Message from: Arlan Organ I      Created: Wed Jun 18, 2012  2:26 PM       Please schedule f/u for 8/27, see onc treatment plan.            LO

## 2012-06-18 NOTE — Telephone Encounter (Signed)
Spoke with Phillip Chapman.  Informed him that schedulers will be calling with information re: appt for followup with Dr. Dalene Carrow to discuss treatment options.

## 2012-06-23 ENCOUNTER — Other Ambulatory Visit: Payer: Self-pay | Admitting: *Deleted

## 2012-06-24 ENCOUNTER — Other Ambulatory Visit: Payer: Medicare Other | Admitting: Lab

## 2012-06-24 ENCOUNTER — Ambulatory Visit: Payer: Medicare Other | Admitting: Family

## 2012-07-04 ENCOUNTER — Other Ambulatory Visit (HOSPITAL_BASED_OUTPATIENT_CLINIC_OR_DEPARTMENT_OTHER): Payer: Medicare Other | Admitting: Lab

## 2012-07-04 ENCOUNTER — Encounter: Payer: Self-pay | Admitting: Family

## 2012-07-04 ENCOUNTER — Encounter: Payer: Self-pay | Admitting: *Deleted

## 2012-07-04 ENCOUNTER — Ambulatory Visit (HOSPITAL_BASED_OUTPATIENT_CLINIC_OR_DEPARTMENT_OTHER): Payer: Medicare Other | Admitting: Family

## 2012-07-04 ENCOUNTER — Telehealth: Payer: Self-pay | Admitting: Hematology and Oncology

## 2012-07-04 VITALS — BP 173/69 | HR 70 | Temp 97.0°F | Resp 18 | Ht 71.0 in | Wt 177.9 lb

## 2012-07-04 DIAGNOSIS — C787 Secondary malignant neoplasm of liver and intrahepatic bile duct: Secondary | ICD-10-CM

## 2012-07-04 DIAGNOSIS — C494 Malignant neoplasm of connective and soft tissue of abdomen: Secondary | ICD-10-CM

## 2012-07-04 DIAGNOSIS — C179 Malignant neoplasm of small intestine, unspecified: Secondary | ICD-10-CM

## 2012-07-04 DIAGNOSIS — C49A0 Gastrointestinal stromal tumor, unspecified site: Secondary | ICD-10-CM

## 2012-07-04 LAB — CBC WITH DIFFERENTIAL/PLATELET
BASO%: 0.7 % (ref 0.0–2.0)
Eosinophils Absolute: 0.1 10*3/uL (ref 0.0–0.5)
HCT: 37.8 % — ABNORMAL LOW (ref 38.4–49.9)
LYMPH%: 28.5 % (ref 14.0–49.0)
MCHC: 34.3 g/dL (ref 32.0–36.0)
MCV: 84.6 fL (ref 79.3–98.0)
MONO%: 16.9 % — ABNORMAL HIGH (ref 0.0–14.0)
NEUT%: 51.4 % (ref 39.0–75.0)
Platelets: 111 10*3/uL — ABNORMAL LOW (ref 140–400)
RBC: 4.47 10*6/uL (ref 4.20–5.82)

## 2012-07-04 LAB — COMPREHENSIVE METABOLIC PANEL (CC13)
Alkaline Phosphatase: 72 U/L (ref 40–150)
Creatinine: 1.2 mg/dL (ref 0.7–1.3)
Glucose: 259 mg/dl — ABNORMAL HIGH (ref 70–99)
Sodium: 138 mEq/L (ref 136–145)
Total Bilirubin: 0.4 mg/dL (ref 0.20–1.20)
Total Protein: 5.9 g/dL — ABNORMAL LOW (ref 6.4–8.3)

## 2012-07-04 NOTE — Progress Notes (Signed)
[facility] Brief Psychosocial Assessment Clinical Social Work  Clinical Social Work was referred by Systems developer for assessment of psychosocial needs.  Clinical Social Worker met with patient and patient's son in exam room at [facility] to offer support and assess for needs.  Pt and pt's wife are both elderly and have multiple health concerns.  Pt's son expressed concern for pt and pt's wife increased needs at home.  Pt's son stated he lives approximately 1 mile from pt, and multiple family members live with in walking distance to pt's home.  Pt's son stated there was no urgent need at this time, but he would like to begin researching his options.  CSW informed pt's some of the insurance requirements associated with a SNF placement, assisted living facilities, and home care options.  CSW provided pt's son with a list of assisted living facilities in [place] and [address], as well as a SNF list for the surrounding areas.  Pt's son plans to review list and contact CSW with additional questions.  CSW offered support and encouraged pt's son to call with any needs or concerns.           Tamala Julian, MSW, LCSW Clinical Social Worker [facility] [phone]

## 2012-07-04 NOTE — Telephone Encounter (Signed)
gve the pt's son the march 2014 appt calendar along with the ct scan appt with instructions.

## 2012-07-04 NOTE — Progress Notes (Signed)
Chapman ID: Phillip Chapman, male   DOB: 10/03/1921, 78 y.o.   MRN: [id number] CSN: [id number]   CC: Phillip Maroon, MD  Phillip Lint, MD  Phillip Chapman, M.D.  Phillip Chapman, M.D.  Identifying Statement: Phillip Chapman is a 78 y.o. Caucasian male with GIST tumor who presents for follow-up.  Interval History: Phillip Chapman is a 78 year-old man with a history of GIST tumor who presents today for follow-up with his son Phillip Chapman.  Phillip Chapman had a CT scans of Phillip chest, abdomen, and pelvis on 04/18/2012. There were no CT findings for metastatic disease. Phillip abdomen and pelvis revealed a new 17-mm left hepatic lobe lesion situated anteriorly which was worrisome for metastases.  A CT guided biopsy was preformed on 05/15/2012.  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  Phillip surgical pathology report on 05/19/2012 showed immunohistochemical findings are consistent with metastatic gastrointestinal stromal tumor.  Phillip Chapman and his son had a meeting with Dr. Donell Chapman, Surgeon.  Dr. Dalene Chapman and Dr. Donell Chapman agree that Phillip Chapman is not a surgical candidate.  Phillip Chapman is quite hard of hearing but does state that he has had a decrease in his energy level during today's visit.  Phillip Chapman also states that he has arthritis pain in both shoulders occasionally.  Phillip Chapman's son states that he is concerned that his father is depressed and worried.  Phillip Chapman states that his father has not been as active as he recently was.  Phillip Chapman also states that his father had a dizzy episode last week.  Phillip Chapman gave Phillip Chapman two doses of Meclizine (Phillip Chapman's wife medication) over two days and Phillip dizziness resolved.  Phillip Chapman states that Phillip Chapman lives with his wife, he completes all of his ADL's daily independently and he is still driving an automobile occasionally.  Phillip Chapman and his son deny any constipation, N/V/D, SOB, headaches,  night sweats, pain, fever or chills.  Phillip Chapman states that he is moving his bowels regularly as he eats prunes every day.  Phillip Chapman received information about hospice at his request.  Phillip Chapman and Phillip Chapman had an extendid discussion with Dr. Dalene Chapman about his options.  Phillip Chapman and Phillip Chapman also had a conversation with Phillip Chapman, [facility] LCSW about long-term care options.  Medications: Current Outpatient Prescriptions  Medication Sig Dispense Refill  . alfuzosin (UROXATRAL) 10 MG 24 hr tablet Take 10 mg by mouth daily.        Marland Kitchen aspirin EC 81 MG tablet Take 81 mg by mouth daily.      Marland Kitchen b complex vitamins capsule Take 1 capsule by mouth daily.      Marland Kitchen donepezil (ARICEPT) 10 MG tablet Take 10 mg by mouth at bedtime.      . dutasteride (AVODART) 0.5 MG capsule Take 0.5 mg by mouth daily.        . ferrous sulfate 325 (65 FE) MG tablet Take 325 mg by mouth 2 (two) times daily.       . fish oil-omega-3 fatty acids 1000 MG capsule Take 1 g by mouth daily.      Marland Kitchen loratadine (CLARITIN) 10 MG tablet Take 10 mg by mouth daily.      . metFORMIN (GLUMETZA) 500 MG (MOD) 24 hr tablet Take 500 mg by mouth daily with breakfast.       . metoprolol succinate (TOPROL-XL) 25 MG 24 hr tablet Take 25 mg  by mouth daily.      . montelukast (SINGULAIR) 10 MG tablet Take 10 mg by mouth at bedtime.      . pravastatin (PRAVACHOL) 20 MG tablet Take 20 mg by mouth daily.        Marland Kitchen PRESCRIPTION MEDICATION Place 1 drop into both eyes as needed. alrex      . vitamin C (ASCORBIC ACID) 500 MG tablet Take 500 mg by mouth daily.        Allergies: Allergies  Allergen Reactions  . Codeine Nausea And Vomiting  . 3p Nol (Acetaminophen) Other (See Comments)    Phillip Chapman, pt's step son, states pt takes tylenol now. States he had reaction to tylenol with codeine . It was codeine which made pt sick.    Past Medical History:   Past Medical History  Diagnosis Date  . Blood in stool april 2011  . Hypertension   . IBS (irritable bowel syndrome)    . Glaucoma   . Stroke   . Chronic kidney disease   . Diabetes mellitus   . Wears dentures   . Arthritis   . Hyperlipidemia   . Osteoporosis   . Hearing loss   . Nasal congestion   . Difficulty urinating   . Arthritis pain   . Weakness   . Confusion   . Cancer     colon    Family History: Family History  Problem Relation Age of Onset  . Cancer Brother   . Heart disease Brother     Social History: History  Substance Use Topics  . Smoking status: Former Smoker    Quit date: 12/09/1981  . Smokeless tobacco: Never Used  . Alcohol Use: No    Review of Systems: 10 point review of systems was completed and is negative except as noted above.   Physical Exam: Blood pressure 173/69, pulse 70, temperature 97 F (36.1 C), temperature source Oral, resp. rate 18, height 5\' 11"  (1.803 m), weight 177 lb 14.4 oz (80.695 kg).  General appearance: Alert, cooperative, well nourished, no acute distress Head: Normocephalic, without obvious abnormality, atraumatic Eyes: Conjunctivae, arcus senilis, PERRLA, EOMI Ears: Left ear TM intact, right ear has a hearing-aid Nose: Nares, septum and mucosa are normal, no drainage or sinus tenderness Neck: No adenopathy, supple, symmetrical, trachea midline, thyroid not enlarged, no tenderness Resp: Clear to auscultation bilaterally, diminished bibasilar breath sounds Cardio: Regular rate and rhythm, S1, S2 normal, no murmur, click, rub or gallop GI: Soft, non-tender, distended,  normoactive bowel sounds, no organomegaly, hernia Extremities: Extremities normal, atraumatic, no cyanosis or edema, areas of hyperpigmentation on extremities  Lymph nodes: Cervical, supraclavicular, and axillary nodes normal Neurologic: Grossly normal   Laboratory Data: Results for orders placed in visit on 07/04/12 (from Phillip past 48 hour(s))  CBC WITH DIFFERENTIAL     Status: Abnormal   Collection Time   07/04/12 10:03 AM      Component Value Range Comment   WBC  3.9 (*) 4.0 - 10.3 10e3/uL    NEUT# 2.0  1.5 - 6.5 10e3/uL    HGB 13.0  13.0 - 17.1 g/dL    HCT 14.7 (*) 82.9 - 49.9 %    Platelets 111 (*) 140 - 400 10e3/uL    MCV 84.6  79.3 - 98.0 fL    MCH 29.0  27.2 - 33.4 pg    MCHC 34.3  32.0 - 36.0 g/dL    RBC 5.62  1.30 - 8.65 10e6/uL    RDW 14.1  11.0 - 14.6 %    lymph# 1.1  0.9 - 3.3 10e3/uL    MONO# 0.7  0.1 - 0.9 10e3/uL    Eosinophils Absolute 0.1  0.0 - 0.5 10e3/uL    Basophils Absolute 0.0  0.0 - 0.1 10e3/uL    NEUT% 51.4  39.0 - 75.0 %    LYMPH% 28.5  14.0 - 49.0 %    MONO% 16.9 (*) 0.0 - 14.0 %    EOS% 2.5  0.0 - 7.0 %    BASO% 0.7  0.0 - 2.0 %   COMPREHENSIVE METABOLIC PANEL (CC13)     Status: Abnormal   Collection Time   07/04/12 10:03 AM      Component Value Range Comment   Sodium 138  136 - 145 mEq/L    Potassium 4.1  3.5 - 5.1 mEq/L    Chloride 105  98 - 107 mEq/L    CO2 24  22 - 29 mEq/L    Glucose 259 (*) 70 - 99 mg/dl    BUN 16.1  7.0 - 09.6 mg/dL    Creatinine 1.2  0.7 - 1.3 mg/dL    Total Bilirubin 0.45  0.20 - 1.20 mg/dL    Alkaline Phosphatase 72  40 - 150 U/L    AST 13  5 - 34 U/L    ALT 9  0 - 55 U/L    Total Protein 5.9 (*) 6.4 - 8.3 g/dL    Albumin 3.4 (*) 3.5 - 5.0 g/dL    Calcium 9.0  8.4 - 40.9 mg/dL      Impression/Plan: Mr. Weatherbee is an 78 year old man who is status post small bowel resection, and partial colectomy on June 01, 2010 for a 5.3 cm high-grade GI stromal tumor. Most recent CT notes a new 17 mm left hepatic lobe lesion.  It was decided by Mr. Beck and his son Phillip Chapman that Phillip Chapman's quality of life is their priority and they are opting to forego radiation therapy and treatment with Gleevec at this time.  Phillip Chapman and Phillip Chapman have spoken to the LCSW to explore living arrangement options for his elderly parents.  Phillip Chapman has also been given contact information to speak to hospice if he chooses.  Phillip Chapman will speak to Dr. Selena Batten about Phillip Chapman's depressed affect.  Phillip Chapman will follow  up with our office in 6 months' time with laboratories and a CT scan of Phillip abdomen to be completed prior to Phillip office visit.   Larina Bras,  NP-C 07/04/2012, 10:05 AM

## 2012-07-04 NOTE — Patient Instructions (Signed)
Patient ID: Phillip Chapman,   DOB: 03-11-1921,  MRN: [id number]   [facility] Discharge Instructions  RECOMMENDATIONS MAD BY THE CONSULTANT AND ANY TEST RESULT(S) WILL BE FORWARDED TO YOU REFERRING DOCTOR   EXAM FINDINGS BY NURSE PRACTITIONER TODAY TO REPORT TO THE CLINIC OR PRIMARY PROVIDER: Follow up with Dr. Selena Batten about Mr. Berns changing mood.   Your Current Medications Are: Current Outpatient Prescriptions  Medication Sig Dispense Refill  . alfuzosin (UROXATRAL) 10 MG 24 hr tablet Take 10 mg by mouth daily.        Marland Kitchen aspirin EC 81 MG tablet Take 81 mg by mouth daily.      Marland Kitchen b complex vitamins capsule Take 1 capsule by mouth daily.      Marland Kitchen donepezil (ARICEPT) 10 MG tablet Take 10 mg by mouth at bedtime.      . dutasteride (AVODART) 0.5 MG capsule Take 0.5 mg by mouth daily.        . ferrous sulfate 325 (65 FE) MG tablet Take 325 mg by mouth 2 (two) times daily.       . fish oil-omega-3 fatty acids 1000 MG capsule Take 1 g by mouth daily.      Marland Kitchen loratadine (CLARITIN) 10 MG tablet Take 10 mg by mouth daily.      . metFORMIN (GLUMETZA) 500 MG (MOD) 24 hr tablet Take 500 mg by mouth daily with breakfast.       . metoprolol succinate (TOPROL-XL) 25 MG 24 hr tablet Take 25 mg by mouth daily.      . montelukast (SINGULAIR) 10 MG tablet Take 10 mg by mouth at bedtime.      . pravastatin (PRAVACHOL) 20 MG tablet Take 20 mg by mouth daily.        Marland Kitchen PRESCRIPTION MEDICATION Place 1 drop into both eyes as needed. alrex      . vitamin C (ASCORBIC ACID) 500 MG tablet Take 500 mg by mouth daily.         INSTRUCTIONS GIVEN, DISCUSSED AND FOLLOW-UP: Try to take walks more frequently.  I acknowledge that I have been informed and understand all the instructions given to me and have received a copy.  I do not have any further questions at this time, but I understand that I may call the [facility] at [phone] during business hours should I have any further questions  or need assistance in obtaining follow-up care.   07/04/2012, 11:13 AM

## 2012-11-29 ENCOUNTER — Encounter: Payer: Self-pay | Admitting: Oncology

## 2012-11-29 ENCOUNTER — Telehealth: Payer: Self-pay | Admitting: Oncology

## 2012-11-29 NOTE — Telephone Encounter (Signed)
Former Dr. Dalene Carrow patient,Talked to pt's son and gave him appt with  new M

## 2012-12-30 ENCOUNTER — Telehealth: Payer: Self-pay | Admitting: *Deleted

## 2012-12-30 ENCOUNTER — Ambulatory Visit (HOSPITAL_COMMUNITY): Payer: Medicare Other

## 2012-12-30 ENCOUNTER — Other Ambulatory Visit: Payer: Medicare Other | Admitting: Lab

## 2012-12-30 NOTE — Telephone Encounter (Signed)
Patient's son called reagarding his dad's appt. Message forwarded to the desk RN.   JMW

## 2012-12-30 NOTE — Telephone Encounter (Signed)
Needs to reschedule lab/CT that was missed today due to road conditions. Scheduled to see Dr. Truett Perna on 01/01/13 at 10:30 Forwarded message to scheduling department.

## 2013-01-01 ENCOUNTER — Ambulatory Visit: Payer: Self-pay | Admitting: Oncology

## 2013-01-01 ENCOUNTER — Other Ambulatory Visit (HOSPITAL_COMMUNITY): Payer: Medicare Other

## 2013-01-01 ENCOUNTER — Ambulatory Visit: Payer: Medicare Other | Admitting: Hematology and Oncology

## 2013-02-13 ENCOUNTER — Other Ambulatory Visit (HOSPITAL_BASED_OUTPATIENT_CLINIC_OR_DEPARTMENT_OTHER): Payer: Medicare Other

## 2013-02-13 ENCOUNTER — Encounter (HOSPITAL_COMMUNITY): Payer: Self-pay

## 2013-02-13 ENCOUNTER — Ambulatory Visit (HOSPITAL_COMMUNITY)
Admission: RE | Admit: 2013-02-13 | Discharge: 2013-02-13 | Disposition: A | Discharge status: Home / Self Care | Payer: Medicare Other | Source: Ambulatory Visit | Attending: Family | Admitting: Family

## 2013-02-13 DIAGNOSIS — C494 Malignant neoplasm of connective and soft tissue of abdomen: Secondary | ICD-10-CM

## 2013-02-13 DIAGNOSIS — C49A0 Gastrointestinal stromal tumor, unspecified site: Secondary | ICD-10-CM

## 2013-02-13 DIAGNOSIS — Z9049 Acquired absence of other specified parts of digestive tract: Secondary | ICD-10-CM | POA: Insufficient documentation

## 2013-02-13 DIAGNOSIS — D481 Neoplasm of uncertain behavior of connective and other soft tissue: Secondary | ICD-10-CM | POA: Insufficient documentation

## 2013-02-13 DIAGNOSIS — D4819 Other specified neoplasm of uncertain behavior of connective and other soft tissue: Secondary | ICD-10-CM | POA: Insufficient documentation

## 2013-02-13 DIAGNOSIS — C787 Secondary malignant neoplasm of liver and intrahepatic bile duct: Secondary | ICD-10-CM | POA: Insufficient documentation

## 2013-02-13 LAB — LACTATE DEHYDROGENASE (CC13): LDH: 156 U/L (ref 125–245)

## 2013-02-13 LAB — CBC WITH DIFFERENTIAL/PLATELET
Basophils Absolute: 0 10*3/uL (ref 0.0–0.1)
Eosinophils Absolute: 0.1 10*3/uL (ref 0.0–0.5)
HCT: 38.2 % — ABNORMAL LOW (ref 38.4–49.9)
LYMPH%: 25 % (ref 14.0–49.0)
MCV: 85.8 fL (ref 79.3–98.0)
MONO#: 0.7 10*3/uL (ref 0.1–0.9)
MONO%: 10.5 % (ref 0.0–14.0)
NEUT#: 3.9 10*3/uL (ref 1.5–6.5)
NEUT%: 61.9 % (ref 39.0–75.0)
Platelets: 145 10*3/uL (ref 140–400)
RBC: 4.46 10*6/uL (ref 4.20–5.82)
WBC: 6.3 10*3/uL (ref 4.0–10.3)

## 2013-02-13 LAB — COMPREHENSIVE METABOLIC PANEL (CC13)
Alkaline Phosphatase: 77 U/L (ref 40–150)
BUN: 16.2 mg/dL (ref 7.0–26.0)
CO2: 26 mEq/L (ref 22–29)
Creatinine: 1.1 mg/dL (ref 0.7–1.3)
Glucose: 141 mg/dl — ABNORMAL HIGH (ref 70–99)
Total Bilirubin: 0.58 mg/dL (ref 0.20–1.20)
Total Protein: 6.6 g/dL (ref 6.4–8.3)

## 2013-02-13 MED ORDER — IOHEXOL 300 MG/ML  SOLN
100.0000 mL | Freq: Once | INTRAMUSCULAR | Status: AC | PRN
  Administered 2013-02-13: 100 mL via INTRAVENOUS

## 2013-02-16 ENCOUNTER — Ambulatory Visit (HOSPITAL_BASED_OUTPATIENT_CLINIC_OR_DEPARTMENT_OTHER): Payer: Medicare Other | Admitting: Oncology

## 2013-02-16 ENCOUNTER — Telehealth: Payer: Self-pay | Admitting: Oncology

## 2013-02-16 VITALS — BP 165/80 | HR 71 | Temp 96.8°F | Resp 18 | Ht 71.0 in | Wt 169.5 lb

## 2013-02-16 DIAGNOSIS — C787 Secondary malignant neoplasm of liver and intrahepatic bile duct: Secondary | ICD-10-CM

## 2013-02-16 DIAGNOSIS — C494 Malignant neoplasm of connective and soft tissue of abdomen: Secondary | ICD-10-CM

## 2013-02-16 DIAGNOSIS — C49A0 Gastrointestinal stromal tumor, unspecified site: Secondary | ICD-10-CM

## 2013-02-16 NOTE — Progress Notes (Signed)
   [facility]    OFFICE PROGRESS NOTE   INTERVAL HISTORY:   Phillip Chapman returns as scheduled. He is accompanied by his stepson/power of attorney. No new complaint. No abdominal pain. Good appetite. He lives in a home with his wife. A caretaker is there 5 days per week. He is ambulatory. The son reports Phillip Chapman hearing loss. Objective:  Vital signs in last 24 hours:  Blood pressure 165/80, pulse 71, temperature 96.8 F (36 C), temperature source Oral, resp. rate 18, height 5\' 11"  (1.803 m), weight 169 lb 8 oz (76.885 kg).    HEENT: neck without mass Lymphatics: no cervical, supraclavicular, axillary, or inguinal nodes Resp: lungs clear bilaterally Cardio: regular rate and rhythm GI: no hepatomegaly, no mass, nontender Vascular: trace edema at the left greater than right lower leg Neuro:alert, follows commands, appears oriented to place and diagnosis      Lab Results:  Lab Results  Component Value Date   WBC 6.3 02/13/2013   HGB 13.1 02/13/2013   HCT 38.2* 02/13/2013   MCV 85.8 02/13/2013   PLT 145 02/13/2013   X-rays: CT the abdomen on 02/13/2013, compared to 04/18/2012-the lung bases are clear. Multiple large metastatic hepatic lesions, most are new. The left hepatic lobe lesion previously biopsied has increased in size now measuring 37 mm compared 17 mm.no mesenteric or retroperitoneal mass or adenopathy.   Medications: I have reviewed the patient's current medications.  Assessment/Plan:  1.metastatic gastrointestinal stromal tumor -Small bowel resection/partial colectomy 06/01/2010,  5.3 cm high-grade gastrointestinal stromal tumor -17 mm left hepatic lobe lesion, status post a CT-guided biopsy on 05/15/2012 confirming metastatic gastrointestinal stromal tumor -CT 02/13/2013 revealed new and enlarging liver metastases  Disposition:  I discussed the progressive metastatic gastrointestinal stromal tumor with Phillip Chapman and his son. He appears  asymptomatic at present. We discussed supportive care versus a trial of Gleevec. We reviewed the potential toxicities associated with Gleevec. His advanced age and comorbid conditions may preclude Gleevec therapy. We could consider starting Gleevec at a lower than standard dose and titrating to tolerance.  The patient's son requested I discussed the case with Dr. Selena Batten. Dr. Selena Batten knows him well and would be able to better decide on supportive care versus Gleevec.  I will contact Dr. Selena Batten this week. Phillip Chapman will return for an office visit in 3 months.   Thornton Papas, MD  02/16/2013  9:12 PM

## 2013-02-23 ENCOUNTER — Telehealth: Payer: Self-pay | Admitting: Dietician

## 2013-02-23 NOTE — Telephone Encounter (Signed)
Brief Outpatient Oncology Nutrition Note  Patient has been identified to be at risk on malnutrition screen.   Wt Readings from Last 10 Encounters:  02/16/13 169 lb 8 oz (76.885 kg)  07/04/12 177 lb 14.4 oz (80.695 kg)  06/13/12 178 lb 6 oz (80.91 kg)  05/15/12 180 lb (81.647 kg)  05/05/12 180 lb (81.647 kg)  04/22/12 180 lb 4.8 oz (81.784 kg)  12/10/11 173 lb 6.4 oz (78.654 kg)    Called and spoke with patient's son who reported father eating but not as well as he used to.  He encouraged Ensure in the past however patient did not drink.  Son to continue to encourage intake.    RD available for any needs.  Oran Rein, RD, LDN

## 2013-04-22 ENCOUNTER — Encounter (INDEPENDENT_AMBULATORY_CARE_PROVIDER_SITE_OTHER): Payer: Self-pay | Admitting: General Surgery

## 2013-05-18 ENCOUNTER — Telehealth: Payer: Self-pay | Admitting: Oncology

## 2013-05-18 ENCOUNTER — Ambulatory Visit: Payer: Medicare Other | Admitting: Lab

## 2013-05-18 ENCOUNTER — Ambulatory Visit (HOSPITAL_BASED_OUTPATIENT_CLINIC_OR_DEPARTMENT_OTHER): Payer: Medicare Other | Admitting: Oncology

## 2013-05-18 VITALS — BP 154/72 | HR 60 | Temp 97.4°F | Resp 17 | Ht 71.0 in | Wt 168.5 lb

## 2013-05-18 DIAGNOSIS — C787 Secondary malignant neoplasm of liver and intrahepatic bile duct: Secondary | ICD-10-CM

## 2013-05-18 DIAGNOSIS — C49A0 Gastrointestinal stromal tumor, unspecified site: Secondary | ICD-10-CM

## 2013-05-18 DIAGNOSIS — C494 Malignant neoplasm of connective and soft tissue of abdomen: Secondary | ICD-10-CM

## 2013-05-18 NOTE — Telephone Encounter (Signed)
lvm for pt that Dr. Truett Perna wanted to him to go to the lab today call back to r/s

## 2013-05-18 NOTE — Progress Notes (Signed)
   [facility]    OFFICE PROGRESS NOTE   INTERVAL HISTORY:   [name] returns with his son. He has no specific complaint. He spends the majority of the day in a house. He walks in his driveway a few times per day.good appetite. No abdominal pain.  Objective:  Vital signs in last 24 hours:  Blood pressure 154/72, pulse 60, temperature 97.4 F (36.3 C), temperature source Oral, resp. rate 17, height 5\' 11"  (1.803 m), weight 168 lb 8 oz (76.431 kg).    HEENT: neck without mass Lymphatics: no cervical, supraclavicular, or axillary nodes Resp: lungs clear bilaterally Cardio: regular rate and rhythm GI: no hepatomegaly, no mass, nontender Vascular: no leg edema Neuro:alert, hearing loss      Lab Results:  Lab Results  Component Value Date   WBC 6.3 02/13/2013   HGB 13.1 02/13/2013   HCT 38.2* 02/13/2013   MCV 85.8 02/13/2013   PLT 145 02/13/2013      Medications: I have reviewed the patient's current medications.  Assessment/Plan: 1.metastatic gastrointestinal stromal tumor  -Small bowel resection/partial colectomy 06/01/2010, 5.3 cm high-grade gastrointestinal stromal tumor  -17 mm left hepatic lobe lesion, status post a CT-guided biopsy on 05/15/2012 confirming metastatic gastrointestinal stromal tumor  -CT 02/13/2013 revealed new and enlarging liver metastases   Disposition:  He appears stable compared to when I saw him in April. I was unable to get in touch with Dr. Selena Batten in April. I discussed treatment options with [name] and his son again today.The son feels he has a reasonable quality of life. [name] would like to treat the metastatic gastrointestinal stromal tumor. We discussed Gleevec therapy. I again reviewed the potential toxicities associated with Gleevec including the chance for diarrhea, edema, skin rash, and fluid retention. He will attend a chemotherapy teaching class.  The plan is to begin Gleevec at a dose of 200 mg daily with  close clinical and laboratory followup. I will discuss the case with Dr. Selena Batten this week to be sure he is in agreement.  The plan is to start Gleevec in approximately one week. He will return for an office visit approximately 2 weeks after starting Gleevec.   Thornton Papas, MD  05/18/2013  9:22 AM

## 2013-05-18 NOTE — Telephone Encounter (Signed)
gv and printed appt sched and avs for pt  °

## 2013-05-19 ENCOUNTER — Encounter: Payer: Self-pay | Admitting: Oncology

## 2013-05-19 ENCOUNTER — Other Ambulatory Visit: Payer: Self-pay | Admitting: *Deleted

## 2013-05-19 DIAGNOSIS — C49A0 Gastrointestinal stromal tumor, unspecified site: Secondary | ICD-10-CM

## 2013-05-19 MED ORDER — IMATINIB MESYLATE 100 MG PO TABS: ORAL_TABLET | ORAL | Status: DC

## 2013-05-19 NOTE — Progress Notes (Signed)
No note

## 2013-05-19 NOTE — Progress Notes (Signed)
Faxed gleevec prescription to [facility] °

## 2013-05-20 ENCOUNTER — Encounter: Payer: Self-pay | Admitting: *Deleted

## 2013-05-20 ENCOUNTER — Telehealth: Payer: Self-pay | Admitting: Oncology

## 2013-05-20 NOTE — Progress Notes (Signed)
Faxed confirmation from [facility] that Gleevec 100 mg tabs is approved through 05/19/2014 under Medicare Part D benefit. Forwarded to managed care.

## 2013-05-20 NOTE — Telephone Encounter (Signed)
s.w. pt sont and advised on lab b4 chem edu...ok and awre

## 2013-05-21 ENCOUNTER — Other Ambulatory Visit (HOSPITAL_BASED_OUTPATIENT_CLINIC_OR_DEPARTMENT_OTHER): Payer: Medicare Other | Admitting: Lab

## 2013-05-21 ENCOUNTER — Other Ambulatory Visit: Payer: Medicare Other

## 2013-05-21 ENCOUNTER — Encounter: Payer: Self-pay | Admitting: Oncology

## 2013-05-21 DIAGNOSIS — C49A0 Gastrointestinal stromal tumor, unspecified site: Secondary | ICD-10-CM

## 2013-05-21 DIAGNOSIS — C494 Malignant neoplasm of connective and soft tissue of abdomen: Secondary | ICD-10-CM

## 2013-05-21 LAB — COMPREHENSIVE METABOLIC PANEL (CC13)
AST: 20 U/L (ref 5–34)
Alkaline Phosphatase: 94 U/L (ref 40–150)
BUN: 22.5 mg/dL (ref 7.0–26.0)
Calcium: 9.2 mg/dL (ref 8.4–10.4)
Creatinine: 1.2 mg/dL (ref 0.7–1.3)

## 2013-05-21 LAB — CBC WITH DIFFERENTIAL/PLATELET
Basophils Absolute: 0 10*3/uL (ref 0.0–0.1)
EOS%: 4.7 % (ref 0.0–7.0)
HCT: 37.4 % — ABNORMAL LOW (ref 38.4–49.9)
HGB: 12.7 g/dL — ABNORMAL LOW (ref 13.0–17.1)
MCH: 29.2 pg (ref 27.2–33.4)
MCV: 85.9 fL (ref 79.3–98.0)
MONO%: 15.1 % — ABNORMAL HIGH (ref 0.0–14.0)
NEUT%: 40.4 % (ref 39.0–75.0)
Platelets: 133 10*3/uL — ABNORMAL LOW (ref 140–400)

## 2013-05-21 NOTE — Progress Notes (Signed)
[facility], [id number], approved gleevec from 05/19/13-05/19/14.

## 2013-05-28 ENCOUNTER — Telehealth: Payer: Self-pay | Admitting: *Deleted

## 2013-05-28 NOTE — Telephone Encounter (Signed)
Called to follow up on status of Gleevec delivery. Says co pay is $2500. Have sent in paperwork for co pay assistance.

## 2013-06-02 NOTE — Telephone Encounter (Signed)
[facility] sent facsimile confirmation of Gleevec prescription shipment.  Gleevec was shipped on 06-01-2013 with next business day delivery.

## 2013-06-10 ENCOUNTER — Ambulatory Visit (HOSPITAL_BASED_OUTPATIENT_CLINIC_OR_DEPARTMENT_OTHER): Payer: Medicare Other | Admitting: Nurse Practitioner

## 2013-06-10 ENCOUNTER — Telehealth: Payer: Self-pay | Admitting: Oncology

## 2013-06-10 ENCOUNTER — Other Ambulatory Visit (HOSPITAL_BASED_OUTPATIENT_CLINIC_OR_DEPARTMENT_OTHER): Payer: Medicare Other | Admitting: Lab

## 2013-06-10 VITALS — BP 139/73 | HR 65 | Temp 97.1°F | Resp 18 | Ht 71.0 in | Wt 169.5 lb

## 2013-06-10 DIAGNOSIS — C49A0 Gastrointestinal stromal tumor, unspecified site: Secondary | ICD-10-CM

## 2013-06-10 DIAGNOSIS — C494 Malignant neoplasm of connective and soft tissue of abdomen: Secondary | ICD-10-CM

## 2013-06-10 DIAGNOSIS — C787 Secondary malignant neoplasm of liver and intrahepatic bile duct: Secondary | ICD-10-CM

## 2013-06-10 LAB — CBC WITH DIFFERENTIAL/PLATELET
Basophils Absolute: 0 10*3/uL (ref 0.0–0.1)
EOS%: 3.1 % (ref 0.0–7.0)
Eosinophils Absolute: 0.1 10*3/uL (ref 0.0–0.5)
HCT: 38.4 % (ref 38.4–49.9)
HGB: 13.1 g/dL (ref 13.0–17.1)
MCH: 29.2 pg (ref 27.2–33.4)
MCV: 85.4 fL (ref 79.3–98.0)
NEUT#: 2.2 10*3/uL (ref 1.5–6.5)
NEUT%: 45.3 % (ref 39.0–75.0)
lymph#: 1.9 10*3/uL (ref 0.9–3.3)

## 2013-06-10 NOTE — Progress Notes (Signed)
OFFICE PROGRESS NOTE  Interval history:  Phillip Chapman returns as scheduled. He began Gleevec 200 mg daily on 06/01/2013. His stomach is "upset" periodically. He does not characterize this as nausea. No vomiting. No diarrhea. No skin rash. He denies leg swelling. Overall good appetite. No pain.   Objective: Blood pressure 139/73, pulse 65, temperature 97.1 F (36.2 C), temperature source Oral, resp. rate 18, height 5\' 11"  (1.803 m), weight 169 lb 8 oz (76.885 kg).  Markedly hard of hearing. No thrush. Lungs clear. Regular cardiac rhythm. Abdomen soft and nontender. No hepatomegaly. Extremities without edema. Calves soft and nontender.  Lab Results: Lab Results  Component Value Date   WBC 4.8 06/10/2013   HGB 13.1 06/10/2013   HCT 38.4 06/10/2013   MCV 85.4 06/10/2013   PLT 133* 06/10/2013    Chemistry:    Chemistry      Component Value Date/Time   NA 140 05/21/2013 0925   NA 138 04/18/2012 0924   NA 134* 11/18/2011 1100   K 3.9 05/21/2013 0925   K 4.5 04/18/2012 0924   K 4.4 11/18/2011 1100   CL 104 02/13/2013 1043   CL 97* 04/18/2012 0924   CL 101 11/18/2011 1100   CO2 24 05/21/2013 0925   CO2 29 04/18/2012 0924   CO2 20 11/18/2011 1100   BUN 22.5 05/21/2013 0925   BUN 18 04/18/2012 0924   BUN 40* 11/18/2011 1100   CREATININE 1.2 05/21/2013 0925   CREATININE 1.2 04/18/2012 0924   CREATININE 1.81* 11/18/2011 1100      Component Value Date/Time   CALCIUM 9.2 05/21/2013 0925   CALCIUM 9.0 04/18/2012 0924   CALCIUM 9.9 11/18/2011 1100   ALKPHOS 94 05/21/2013 0925   ALKPHOS 74 04/18/2012 0924   ALKPHOS 78 05/26/2010 0815   AST 20 05/21/2013 0925   AST 26 04/18/2012 0924   AST 15 05/26/2010 0815   ALT 18 05/21/2013 0925   ALT 19 04/18/2012 0924   ALT 10 05/26/2010 0815   BILITOT 0.48 05/21/2013 0925   BILITOT 0.80 04/18/2012 0924   BILITOT 0.4 05/26/2010 0815       Studies/Results: No results found.  Medications: I have reviewed the patient's current  medications.  Assessment/Plan:  1. Metastatic gastrointestinal stromal tumor.  Small bowel resection/partial colectomy 06/01/2010, 5.3 cm high-grade gastrointestinal stromal tumor.   17 mm left hepatic lobe lesion status post CT-guided biopsy on 05/15/2012 confirming metastatic gastrointestinal stromal tumor.   CT 02/13/2013 revealed new and enlarging liver metastases.  Initiation of Gleevec 200 mg daily 06/01/2013. 2. Hearing deficit.  Disposition-Phillip Chapman appears stable. He will continue Gleevec 200 mg daily. He will return for a followup visit in one month. I offered an antiemetic prescription. His son prefers to contact the office if they feel a nausea medicine is needed.  Plan reviewed with Dr. Truett Perna.  Lonna Cobb ANP/GNP-BC

## 2013-06-10 NOTE — Telephone Encounter (Signed)
gv and printed appt sched and avs forpt pt needed early morning...ok per pof

## 2013-06-25 ENCOUNTER — Other Ambulatory Visit: Payer: Self-pay | Admitting: *Deleted

## 2013-06-25 DIAGNOSIS — C49A0 Gastrointestinal stromal tumor, unspecified site: Secondary | ICD-10-CM

## 2013-06-25 NOTE — Telephone Encounter (Signed)
THIS REFILL REQUEST FOR GLEEVEC WAS GIVEN TO DR.SHERRILL'S NURSE, SUSAN COWARD,RN. 

## 2013-06-26 ENCOUNTER — Telehealth: Payer: Self-pay | Admitting: *Deleted

## 2013-06-26 MED ORDER — IMATINIB MESYLATE 100 MG PO TABS: ORAL_TABLET | ORAL | Status: DC

## 2013-06-26 NOTE — Addendum Note (Signed)
Addended by: Wandalee Ferdinand on: 06/26/2013 03:12 PM   Modules accepted: Orders

## 2013-06-26 NOTE — Telephone Encounter (Signed)
Received fax from [facility]- Thank you for the referral.

## 2013-07-02 ENCOUNTER — Telehealth: Payer: Self-pay | Admitting: *Deleted

## 2013-07-02 NOTE — Telephone Encounter (Signed)
Received confirmation fax from [facility] stating gleevec was shipped 07/01/13 for delivery next business day.

## 2013-07-09 ENCOUNTER — Other Ambulatory Visit (HOSPITAL_BASED_OUTPATIENT_CLINIC_OR_DEPARTMENT_OTHER): Payer: Medicare Other | Admitting: Lab

## 2013-07-09 ENCOUNTER — Encounter: Payer: Self-pay | Admitting: Nurse Practitioner

## 2013-07-09 ENCOUNTER — Telehealth: Payer: Self-pay

## 2013-07-09 ENCOUNTER — Ambulatory Visit (HOSPITAL_BASED_OUTPATIENT_CLINIC_OR_DEPARTMENT_OTHER): Payer: Medicare Other | Admitting: Nurse Practitioner

## 2013-07-09 VITALS — BP 144/77 | HR 66 | Temp 96.9°F | Resp 20 | Ht 71.0 in | Wt 169.7 lb

## 2013-07-09 DIAGNOSIS — C49A0 Gastrointestinal stromal tumor, unspecified site: Secondary | ICD-10-CM

## 2013-07-09 DIAGNOSIS — C494 Malignant neoplasm of connective and soft tissue of abdomen: Secondary | ICD-10-CM

## 2013-07-09 DIAGNOSIS — C787 Secondary malignant neoplasm of liver and intrahepatic bile duct: Secondary | ICD-10-CM

## 2013-07-09 LAB — CBC WITH DIFFERENTIAL/PLATELET
Basophils Absolute: 0.1 10*3/uL (ref 0.0–0.1)
EOS%: 7.8 % — ABNORMAL HIGH (ref 0.0–7.0)
HGB: 12.2 g/dL — ABNORMAL LOW (ref 13.0–17.1)
LYMPH%: 30.2 % (ref 14.0–49.0)
MCH: 29.8 pg (ref 27.2–33.4)
MCV: 86.2 fL (ref 79.3–98.0)
MONO%: 10.1 % (ref 0.0–14.0)
Platelets: 135 10*3/uL — ABNORMAL LOW (ref 140–400)
RDW: 14.5 % (ref 11.0–14.6)

## 2013-07-09 LAB — COMPREHENSIVE METABOLIC PANEL (CC13)
AST: 18 U/L (ref 5–34)
Albumin: 3.3 g/dL — ABNORMAL LOW (ref 3.5–5.0)
Alkaline Phosphatase: 76 U/L (ref 40–150)
BUN: 16.3 mg/dL (ref 7.0–26.0)
Creatinine: 1.1 mg/dL (ref 0.7–1.3)
Potassium: 4.3 mEq/L (ref 3.5–5.1)
Total Bilirubin: 0.51 mg/dL (ref 0.20–1.20)

## 2013-07-09 NOTE — Progress Notes (Signed)
OFFICE PROGRESS NOTE  Interval history:  Phillip Chapman is an 79 year old man with metastatic GIST. He began Gleevec 200 mg daily on 06/01/2013. He presents today for routine followup.   Energy level is poor. He has mild intermittent nausea relieved with Zofran. No diarrhea. He is intermittently constipated and takes MiraLAX as needed. He denies shortness of breath. No leg swelling. No skin rash. He has a good appetite but does note an alteration in taste. No fevers. He denies pain. Specifically no abdominal pain. He has an indwelling Foley catheter that is changed once a month. Overall he feels weak. His son notes that his energy level has decreased since beginning Gleevec. In addition they have noticed a significant decrease in his hearing since beginning Gleevec. His son reports baseline hearing loss requiring a hearing aid. They have recently noticed a marked decrease in his level of hearing. They went to the audiologist who confirmed the hearing aids were working properly and testing confirmed further hearing loss.   Objective: Blood pressure 144/77, pulse 66, temperature 96.9 F (36.1 C), temperature source Oral, resp. rate 20, height 5\' 11"  (1.803 m), weight 169 lb 11.2 oz (76.975 kg), SpO2 99.00%.  Oropharynx is without thrush or ulceration. No palpable cervical, supraclavicular or axillary lymph nodes. Lungs are clear. No wheezes or rales. Regular cardiac rhythm. No murmur. Abdomen is soft and nontender. No hepatomegaly. Nondistended. Ventral hernia. No leg edema. No skin rash. He is alert and oriented. Hearing deficit.  Lab Results: Lab Results  Component Value Date   WBC 5.0 07/09/2013   HGB 12.2* 07/09/2013   HCT 35.2* 07/09/2013   MCV 86.2 07/09/2013   PLT 135* 07/09/2013    Chemistry:    Chemistry      Component Value Date/Time   NA 140 07/09/2013 0843   NA 138 04/18/2012 0924   NA 134* 11/18/2011 1100   K 4.3 07/09/2013 0843   K 4.5 04/18/2012 0924   K 4.4 11/18/2011 1100   CL 104  02/13/2013 1043   CL 97* 04/18/2012 0924   CL 101 11/18/2011 1100   CO2 28 07/09/2013 0843   CO2 29 04/18/2012 0924   CO2 20 11/18/2011 1100   BUN 16.3 07/09/2013 0843   BUN 18 04/18/2012 0924   BUN 40* 11/18/2011 1100   CREATININE 1.1 07/09/2013 0843   CREATININE 1.2 04/18/2012 0924   CREATININE 1.81* 11/18/2011 1100      Component Value Date/Time   CALCIUM 9.0 07/09/2013 0843   CALCIUM 9.0 04/18/2012 0924   CALCIUM 9.9 11/18/2011 1100   ALKPHOS 76 07/09/2013 0843   ALKPHOS 74 04/18/2012 0924   ALKPHOS 78 05/26/2010 0815   AST 18 07/09/2013 0843   AST 26 04/18/2012 0924   AST 15 05/26/2010 0815   ALT 13 07/09/2013 0843   ALT 19 04/18/2012 0924   ALT 10 05/26/2010 0815   BILITOT 0.51 07/09/2013 0843   BILITOT 0.80 04/18/2012 0924   BILITOT 0.4 05/26/2010 0815       Studies/Results: No results found.  Medications: I have reviewed the patient's current medications.  Assessment/Plan:  1. Metastatic gastrointestinal stromal tumor. Small bowel resection/partial colectomy 06/01/2010, 5.3 cm high-grade gastrointestinal stromal tumor.  17 mm left hepatic lobe lesion status post CT-guided biopsy on 05/15/2012 confirming metastatic gastrointestinal stromal tumor.  CT 02/13/2013 revealed new and enlarging liver metastases.  Initiation of Gleevec 200 mg daily 06/01/2013. 2. Hearing deficit. Progressive since beginning Gleevec. 3. Fatigue/malaise. Progressive since beginning Gleevec. 4. Mild intermittent nausea.  Relieved with Zofran.  Disposition-Phillip Chapman is currently on Gleevec 200 mg daily. Overall he is tolerating the Gleevec well.   He has had recent progressive hearing loss. We discussed with the [facility] pharmacist. There have been reports of hearing loss with Gleevec.  We instructed Phillip Chapman to place the Gleevec on hold. We will see him back in 2 weeks to reevaluate.  Plan reviewed with Dr. Truett Perna.  Lonna Cobb ANP/GNP-BC

## 2013-07-09 NOTE — Telephone Encounter (Signed)
gv and printed appt sched and avs for pt for SEpt and OCT °

## 2013-07-24 ENCOUNTER — Ambulatory Visit (HOSPITAL_BASED_OUTPATIENT_CLINIC_OR_DEPARTMENT_OTHER): Payer: Medicare Other | Admitting: Nurse Practitioner

## 2013-07-24 ENCOUNTER — Ambulatory Visit (HOSPITAL_COMMUNITY)
Admission: RE | Admit: 2013-07-24 | Discharge: 2013-07-24 | Disposition: A | Discharge status: Home / Self Care | Payer: Medicare Other | Source: Ambulatory Visit | Attending: Oncology | Admitting: Oncology

## 2013-07-24 ENCOUNTER — Telehealth: Payer: Self-pay | Admitting: Oncology

## 2013-07-24 VITALS — BP 151/77 | HR 71 | Temp 96.8°F | Resp 18 | Ht 71.0 in | Wt 170.6 lb

## 2013-07-24 DIAGNOSIS — R609 Edema, unspecified: Secondary | ICD-10-CM

## 2013-07-24 DIAGNOSIS — C494 Malignant neoplasm of connective and soft tissue of abdomen: Secondary | ICD-10-CM

## 2013-07-24 DIAGNOSIS — R21 Rash and other nonspecific skin eruption: Secondary | ICD-10-CM

## 2013-07-24 DIAGNOSIS — R110 Nausea: Secondary | ICD-10-CM

## 2013-07-24 DIAGNOSIS — C49A0 Gastrointestinal stromal tumor, unspecified site: Secondary | ICD-10-CM

## 2013-07-24 DIAGNOSIS — R5381 Other malaise: Secondary | ICD-10-CM

## 2013-07-24 DIAGNOSIS — C787 Secondary malignant neoplasm of liver and intrahepatic bile duct: Secondary | ICD-10-CM

## 2013-07-24 DIAGNOSIS — L089 Local infection of the skin and subcutaneous tissue, unspecified: Secondary | ICD-10-CM

## 2013-07-24 NOTE — Telephone Encounter (Signed)
gv and printed appt sched and avs forpt for OCT...sent pt to have doppler

## 2013-07-24 NOTE — Progress Notes (Addendum)
OFFICE PROGRESS NOTE  Interval history:  Phillip Chapman is an 79 year old man with metastatic GIST. He began Gleevec 200 mg daily on 06/01/2013. At the time of a routine followup visit on 07/09/2013 he and his son reported progressive hearing loss. We found reports of hearing loss associated with Gleevec. The Gleevec was placed on hold. He is seen today for followup.  Phillip Chapman and his son have noted improvement in his hearing over the past 2 weeks since being off of Gleevec. His son feels that overall he is better as well. His energy level has improved. He has a good appetite. He denies pain. He denies shortness of breath and leg swelling. He is intermittently constipated and takes MiraLAX as needed. He reports mild intermittent nausea. No vomiting. His son reports a caregiver noted a small amount of blood in his underwear this morning. A bilateral groin rash was noted. He has an indwelling Foley catheter. They have noted no blood in his urine. He denies any bloody or black stools.   Objective: Blood pressure 151/77, pulse 71, temperature 96.8 F (36 C), temperature source Oral, resp. rate 18, height 5\' 11"  (1.803 m), weight 170 lb 9.6 oz (77.384 kg).  Oropharynx is without thrush or ulceration. Mucous membranes are moist. No palpable cervical or supraclavicular lymph nodes. Lungs are clear. No wheezes or rales. Regular cardiac rhythm. No murmur. Abdomen is soft and nontender. No hepatomegaly. Ventral hernia. The entire right leg is mildly edematous. Calves are soft and nontender. Erythematous rash bilateral groin regions. The upper portion of the rash at the right groin appears mildly excoriated. He is alert and oriented. Hearing deficit. He has an indwelling Foley catheter. The urine in the collection bag is clear yellow. Right second toe has an area of superficial ulceration at the tip. A portion of the nail appears to be missing. There is debris covering the wound. The wound does not appear grossly  infected but we were unable to remove the debris for full evaluation due to discomfort.   Lab Results: Lab Results  Component Value Date   WBC 5.0 07/09/2013   HGB 12.2* 07/09/2013   HCT 35.2* 07/09/2013   MCV 86.2 07/09/2013   PLT 135* 07/09/2013    Chemistry:    Chemistry      Component Value Date/Time   NA 140 07/09/2013 0843   NA 138 04/18/2012 0924   NA 134* 11/18/2011 1100   K 4.3 07/09/2013 0843   K 4.5 04/18/2012 0924   K 4.4 11/18/2011 1100   CL 104 02/13/2013 1043   CL 97* 04/18/2012 0924   CL 101 11/18/2011 1100   CO2 28 07/09/2013 0843   CO2 29 04/18/2012 0924   CO2 20 11/18/2011 1100   BUN 16.3 07/09/2013 0843   BUN 18 04/18/2012 0924   BUN 40* 11/18/2011 1100   CREATININE 1.1 07/09/2013 0843   CREATININE 1.2 04/18/2012 0924   CREATININE 1.81* 11/18/2011 1100      Component Value Date/Time   CALCIUM 9.0 07/09/2013 0843   CALCIUM 9.0 04/18/2012 0924   CALCIUM 9.9 11/18/2011 1100   ALKPHOS 76 07/09/2013 0843   ALKPHOS 74 04/18/2012 0924   ALKPHOS 78 05/26/2010 0815   AST 18 07/09/2013 0843   AST 26 04/18/2012 0924   AST 15 05/26/2010 0815   ALT 13 07/09/2013 0843   ALT 19 04/18/2012 0924   ALT 10 05/26/2010 0815   BILITOT 0.51 07/09/2013 0843   BILITOT 0.80 04/18/2012 0924   BILITOT  0.4 05/26/2010 0815       Studies/Results: No results found.  Medications: I have reviewed the patient's current medications.  Assessment/Plan:  1. Metastatic gastrointestinal stromal tumor. Small bowel resection/partial colectomy 06/01/2010, 5.3 cm high-grade gastrointestinal stromal tumor.  17 mm left hepatic lobe lesion status post CT-guided biopsy on 05/15/2012 confirming metastatic gastrointestinal stromal tumor.  CT 02/13/2013 revealed new and enlarging liver metastases.  Initiation of Gleevec 200 mg daily 06/01/2013. Gleevec placed on hold 07/09/2013 due to progressive hearing loss. 2. Hearing deficit. Progressive since beginning Gleevec. Hearing is better since Gleevec was discontinued on  07/09/2013. 3. Fatigue/malaise. Progressive since beginning Gleevec. Improved. 4. Mild intermittent nausea. He has Zofran for as needed use. 5. Right leg edema. We are referring him for a venous Doppler. 6. Superficial wound right second toe. We were unable to fully evaluate due to discomfort/debris. We recommended he followup with his podiatrist.  Disposition-Phillip Chapman's hearing has improved since the Gleevec was placed on hold. He does not want to resume the Gleevec. He would like to try another treatment for the metastatic GIST.  Dr. Truett Perna recommends Sutent at a dose of 25 mg daily. We reviewed potential toxicities including mouth sores, myelosuppression, rash, diarrhea, increased risk of blood clots.  On exam today he was noted to have edema of the right leg. We are referring him for a venous Doppler. We also noted a superficial wound at the right second toe that we were unable to fully evaluate. His son will take him to a podiatrist.  We anticipate he will begin Sutent in approximately one week. He will return for a followup visit on 08/17/2013. He will contact the office in the interim with any problems.  Patient seen with Dr. Truett Perna.  Lonna Cobb ANP/GNP-BC  This was shared visit with Lonna Cobb. The hearing loss has improved after discontinuation of Gleevec. He does not wish to resume Gleevec. We discussed comfort care versus treatment with a different medication. He agrees to a trial of sunitinib. We discussed the potential toxicities associated with sunitinib. The plan is to begin sunitinib at a dose of 25 mg daily. He will be referred for a CT of the abdomen within the next one week.    The right leg Doppler was negative for a deep vein thrombosis and revealed a Baker's cyst.  Mancel Bale, M.D.

## 2013-07-24 NOTE — Progress Notes (Signed)
VASCULAR LAB PRELIMINARY  PRELIMINARY  PRELIMINARY  PRELIMINARY  Right lower extremity venous duplex completed.    Preliminary report:  Right:  No evidence of DVTor superficial thrombosis. There is an area in the popliteal fossa consistent with a small Baker's cyst.  Edna Grover, RVS 07/24/2013, 11:05 AM

## 2013-07-27 ENCOUNTER — Telehealth: Payer: Self-pay | Admitting: *Deleted

## 2013-07-27 ENCOUNTER — Other Ambulatory Visit: Payer: Self-pay | Admitting: *Deleted

## 2013-07-27 MED ORDER — SUNITINIB MALATE 25 MG PO CAPS: 25.0000 mg | ORAL_CAPSULE | Freq: Every day | ORAL | Status: DC

## 2013-07-27 NOTE — Telephone Encounter (Signed)
Called pt's son. Dr. Truett Perna is ordering CT abdomen to get a baseline evaluation of liver lesions prior to beginning Sutent. Phillip Chapman stated he plans to call and schedule scan once he figures out his work schedule.

## 2013-07-30 ENCOUNTER — Telehealth: Payer: Self-pay | Admitting: *Deleted

## 2013-07-30 ENCOUNTER — Ambulatory Visit (HOSPITAL_COMMUNITY)
Admission: RE | Admit: 2013-07-30 | Discharge: 2013-07-30 | Disposition: A | Discharge status: Home / Self Care | Payer: Medicare Other | Source: Ambulatory Visit | Attending: Oncology | Admitting: Oncology

## 2013-07-30 DIAGNOSIS — I700 Atherosclerosis of aorta: Secondary | ICD-10-CM | POA: Insufficient documentation

## 2013-07-30 DIAGNOSIS — C787 Secondary malignant neoplasm of liver and intrahepatic bile duct: Secondary | ICD-10-CM | POA: Insufficient documentation

## 2013-07-30 DIAGNOSIS — K449 Diaphragmatic hernia without obstruction or gangrene: Secondary | ICD-10-CM | POA: Insufficient documentation

## 2013-07-30 DIAGNOSIS — Q619 Cystic kidney disease, unspecified: Secondary | ICD-10-CM | POA: Insufficient documentation

## 2013-07-30 DIAGNOSIS — C494 Malignant neoplasm of connective and soft tissue of abdomen: Secondary | ICD-10-CM | POA: Insufficient documentation

## 2013-07-30 DIAGNOSIS — C49A0 Gastrointestinal stromal tumor, unspecified site: Secondary | ICD-10-CM

## 2013-07-30 MED ORDER — IOHEXOL 300 MG/ML  SOLN
100.0000 mL | Freq: Once | INTRAMUSCULAR | Status: AC | PRN
  Administered 2013-07-30: 100 mL via INTRAVENOUS

## 2013-07-30 NOTE — Telephone Encounter (Signed)
Sutent requires prior authorization from insurance company. She will complete this if office will fax her last clinic notes. Office notes of 07/24/13 faxed to [facility] representative.

## 2013-07-31 ENCOUNTER — Telehealth: Payer: Self-pay | Admitting: *Deleted

## 2013-07-31 NOTE — Telephone Encounter (Signed)
Message copied by Wandalee Ferdinand on Fri Jul 31, 2013  3:08 PM ------      Message from: Phillip Chapman      Created: Thu Jul 30, 2013 11:07 AM       Please call son, liver lesions are larger as expected. No new findings.  Proceed with Sutent ------

## 2013-07-31 NOTE — Telephone Encounter (Signed)
Notified son of CT results and to begin Sutent when it arrives. He will call with start date. Drug should arrive by Monday.

## 2013-08-03 NOTE — Telephone Encounter (Signed)
RECEIVED A FAX FROM [facility] CONCERNING A CONFIRMATION OF PRESCRIPTION SHIPMENT FOR SUTENT ON 07/31/13.

## 2013-08-04 ENCOUNTER — Other Ambulatory Visit: Payer: Medicare Other | Admitting: Lab

## 2013-08-04 ENCOUNTER — Telehealth: Payer: Self-pay | Admitting: *Deleted

## 2013-08-04 ENCOUNTER — Ambulatory Visit: Payer: Medicare Other | Admitting: Oncology

## 2013-08-04 NOTE — Telephone Encounter (Signed)
Message from pt's stepson reporting pt will begin Sutent today.

## 2013-08-17 ENCOUNTER — Telehealth: Payer: Self-pay | Admitting: Oncology

## 2013-08-17 ENCOUNTER — Other Ambulatory Visit (HOSPITAL_BASED_OUTPATIENT_CLINIC_OR_DEPARTMENT_OTHER): Payer: Medicare Other | Admitting: Lab

## 2013-08-17 ENCOUNTER — Ambulatory Visit (HOSPITAL_BASED_OUTPATIENT_CLINIC_OR_DEPARTMENT_OTHER): Payer: Medicare Other | Admitting: Oncology

## 2013-08-17 VITALS — BP 154/76 | HR 59 | Temp 97.1°F | Resp 18 | Ht 71.0 in | Wt 166.4 lb

## 2013-08-17 DIAGNOSIS — M7120 Synovial cyst of popliteal space [Baker], unspecified knee: Secondary | ICD-10-CM

## 2013-08-17 DIAGNOSIS — C494 Malignant neoplasm of connective and soft tissue of abdomen: Secondary | ICD-10-CM

## 2013-08-17 DIAGNOSIS — C49A0 Gastrointestinal stromal tumor, unspecified site: Secondary | ICD-10-CM

## 2013-08-17 DIAGNOSIS — C787 Secondary malignant neoplasm of liver and intrahepatic bile duct: Secondary | ICD-10-CM

## 2013-08-17 DIAGNOSIS — H9190 Unspecified hearing loss, unspecified ear: Secondary | ICD-10-CM

## 2013-08-17 LAB — CBC WITH DIFFERENTIAL/PLATELET
BASO%: 0.7 % (ref 0.0–2.0)
EOS%: 2.6 % (ref 0.0–7.0)
MCH: 29.4 pg (ref 27.2–33.4)
MCHC: 34.1 g/dL (ref 32.0–36.0)
NEUT#: 3.1 10*3/uL (ref 1.5–6.5)
NEUT%: 52.8 % (ref 39.0–75.0)
RDW: 14.3 % (ref 11.0–14.6)
lymph#: 2.3 10*3/uL (ref 0.9–3.3)

## 2013-08-17 LAB — COMPREHENSIVE METABOLIC PANEL (CC13)
ALT: 8 U/L (ref 0–55)
AST: 17 U/L (ref 5–34)
Anion Gap: 10 mEq/L (ref 3–11)
Calcium: 9.1 mg/dL (ref 8.4–10.4)
Chloride: 107 mEq/L (ref 98–109)
Creatinine: 1.1 mg/dL (ref 0.7–1.3)
Potassium: 4.3 mEq/L (ref 3.5–5.1)
Sodium: 142 mEq/L (ref 136–145)
Total Protein: 6.5 g/dL (ref 6.4–8.3)

## 2013-08-17 NOTE — Progress Notes (Signed)
   [facility]    OFFICE PROGRESS NOTE   INTERVAL HISTORY:   Mr. 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 He began Sutent on 08/04/2013. His son reports there's been no apparent toxicity from the Sutent. No mouth sores, diarrhea, or skin rash. No change in the hearing loss. The son reports Mr. Mitrano is intermittently confused.  Objective:  Vital signs in last 24 hours:  Blood pressure 154/76, pulse 59, temperature 97.1 F (36.2 C), temperature source Oral, resp. rate 18, height 5\' 11"  (1.803 m), weight 166 lb 6.4 oz (75.479 kg).    HEENT: No thrush or ulcers Resp: Lungs clear bilaterally Cardio: Regular rate and rhythm GI: No hepatomegaly, nontender Vascular: No leg edema Neuro: Follows commands, alert    Lab Results:  Lab Results  Component Value Date   WBC 5.9 08/17/2013   HGB 13.4 08/17/2013   HCT 39.3 08/17/2013   MCV 86.2 08/17/2013   PLT 133* 08/17/2013   ANC 3.1  X-rays: CT of the abdomen 07/30/2013, compared to 02/13/2013-multiple liver lesions have enlarged.  Medications: I have reviewed the patient's current medications.  Assessment/Plan: 1. Metastatic gastrointestinal stromal tumor. Small bowel resection/partial colectomy 06/01/2010, 5.3 cm high-grade gastrointestinal stromal tumor.  17 mm left hepatic lobe lesion status post CT-guided biopsy on 05/15/2012 confirming metastatic gastrointestinal stromal tumor.  CT 02/13/2013 revealed new and enlarging liver metastases.  Initiation of Gleevec 200 mg daily 06/01/2013.  Gleevec placed on hold 07/09/2013 due to progressive hearing loss. CT of the abdomen 07/30/2013 with a mild increase in the size of metastatic liver lesions compared to the 02/13/2013 CT Initiation of daily Sutent on 08/04/2013 2. Hearing deficit. Progressive since beginning Gleevec. Hearing is better since Gleevec was discontinued on 07/09/2013. 3. Fatigue/malaise. Progressive  since beginning Gleevec. Improved. 4. Mild intermittent nausea. He has Zofran for as needed use. 5. Right leg edema. Negative Doppler 07/24/2013, positive for a Baker's cyst   Disposition:  Mr. Schupp appears stable. He appears to be tolerating the Sutent without significant toxicity. He will continue daily Sutent. Mr. Heaphy will return for an office and lab visit in 3-4 weeks.   Thornton Papas, MD  08/17/2013  4:34 PM

## 2013-08-17 NOTE — Telephone Encounter (Signed)
gv and printed appt sched and avs for pt for NOV °

## 2013-08-21 ENCOUNTER — Encounter: Payer: Self-pay | Admitting: *Deleted

## 2013-08-21 NOTE — Progress Notes (Signed)
RECEIVED A FAX FROM [employer] CONCERNING A PATIENT UPDATE: ADVERSE EVENT MONITORING. THIS INFORMATION SHEET WAS GIVEN TO DR.SHERRILL'S NURSE, TANYA WHITLOCK,RN.

## 2013-08-25 ENCOUNTER — Other Ambulatory Visit: Payer: Self-pay | Admitting: *Deleted

## 2013-08-25 NOTE — Telephone Encounter (Signed)
THIS REFILL REQUEST FOR SUTENT WAS PLACED ON DR.SHERRILL'S DESK.

## 2013-08-26 MED ORDER — SUNITINIB MALATE 25 MG PO CAPS: 25.0000 mg | ORAL_CAPSULE | Freq: Every day | ORAL | Status: DC

## 2013-08-26 NOTE — Addendum Note (Signed)
Addended by: Wandalee Ferdinand on: 08/26/2013 05:18 PM   Modules accepted: Orders

## 2013-09-01 NOTE — Telephone Encounter (Signed)
RECEIVED A FAX FROM [employer] CONCERNING A CONFIRMATION OF PRESCRIPTION SHIPMENT FOR SUTENT ON 08/31/13.

## 2013-09-07 ENCOUNTER — Telehealth: Payer: Self-pay | Admitting: *Deleted

## 2013-09-07 NOTE — Telephone Encounter (Signed)
VM reporting "chemo pills are causing too much trouble". Requests return call.

## 2013-09-07 NOTE — Telephone Encounter (Signed)
Phillip Chapman was driving and could not talk-has noted more confusion and getting much weaker. Having more trouble walking safely in home. Instructed him to hold the Sutent tomorrow morning and nurse will call back to discuss further.

## 2013-09-08 ENCOUNTER — Telehealth: Payer: Self-pay | Admitting: *Deleted

## 2013-09-08 NOTE — Telephone Encounter (Signed)
Reports his BP is getting higher and he is getting more confused to point last night he did not know where he was. Sleeps all the time. Tried to get him from chair to bed last night and he could barely move his legs and support his weight with walker and [name] helping him ambulate.  Is going to hold off on his Sutent until seen by Dr. Truett Perna on 11/13, but feels he may need to be seen or even taken to the hospital sooner. Asking for Dr. Kalman Drape input.

## 2013-09-08 NOTE — Telephone Encounter (Signed)
Per MD, called and spoke to Phillip Chapman (stepson) and instructed him that if pt BP is not going back to normal range after stopping Sutent or pt more confused; he needs to be seen by PCP or go to the ED to be evaluated --if he feels like he can't wait for this office to see him on 09/10/13.  Phillip Chapman states that he called PCP and "they said it was up to [place]"  Instructed him if he felt pt not getting better or worsens he should take pt to ED.  Phillip Chapman verbalized understanding and expressed appreciation for call back.

## 2013-09-10 ENCOUNTER — Ambulatory Visit (HOSPITAL_BASED_OUTPATIENT_CLINIC_OR_DEPARTMENT_OTHER): Payer: Medicare Other | Admitting: Nurse Practitioner

## 2013-09-10 ENCOUNTER — Telehealth: Payer: Self-pay | Admitting: Nurse Practitioner

## 2013-09-10 ENCOUNTER — Encounter (INDEPENDENT_AMBULATORY_CARE_PROVIDER_SITE_OTHER): Payer: Self-pay

## 2013-09-10 ENCOUNTER — Other Ambulatory Visit (HOSPITAL_BASED_OUTPATIENT_CLINIC_OR_DEPARTMENT_OTHER): Payer: Medicare Other | Admitting: Lab

## 2013-09-10 VITALS — BP 133/76 | HR 74 | Temp 96.9°F | Resp 17 | Ht 71.0 in | Wt 163.5 lb

## 2013-09-10 DIAGNOSIS — C494 Malignant neoplasm of connective and soft tissue of abdomen: Secondary | ICD-10-CM

## 2013-09-10 DIAGNOSIS — F29 Unspecified psychosis not due to a substance or known physiological condition: Secondary | ICD-10-CM

## 2013-09-10 DIAGNOSIS — C49A0 Gastrointestinal stromal tumor, unspecified site: Secondary | ICD-10-CM

## 2013-09-10 DIAGNOSIS — C787 Secondary malignant neoplasm of liver and intrahepatic bile duct: Secondary | ICD-10-CM

## 2013-09-10 DIAGNOSIS — R5381 Other malaise: Secondary | ICD-10-CM

## 2013-09-10 LAB — CBC WITH DIFFERENTIAL/PLATELET
Basophils Absolute: 0 10*3/uL (ref 0.0–0.1)
EOS%: 1.9 % (ref 0.0–7.0)
Eosinophils Absolute: 0.1 10*3/uL (ref 0.0–0.5)
HGB: 13.3 g/dL (ref 13.0–17.1)
LYMPH%: 52 % — ABNORMAL HIGH (ref 14.0–49.0)
MCH: 30.4 pg (ref 27.2–33.4)
MCV: 90.3 fL (ref 79.3–98.0)
MONO%: 12.5 % (ref 0.0–14.0)
NEUT%: 33.1 % — ABNORMAL LOW (ref 39.0–75.0)
Platelets: 82 10*3/uL — ABNORMAL LOW (ref 140–400)
RDW: 16.2 % — ABNORMAL HIGH (ref 11.0–14.6)

## 2013-09-10 NOTE — Telephone Encounter (Signed)
appts made per 11/13 POF AVS and cal given shh

## 2013-09-10 NOTE — Progress Notes (Signed)
OFFICE PROGRESS NOTE  Interval history:  Phillip Chapman returns for followup of metastatic gastrointestinal stromal tumor. He began Sutent on 08/04/2013. He denies mouth sores. No diarrhea. No skin rash. He denies abdominal pain.  Beginning 09/05/2013 there was a significant decline in his energy level. The following day he was also confused and unable to walk. His son noted that his blood pressure was increased. He was having no fever or chills. Sutent was discontinued on 09/08/2013. His son notes significant improvement in his condition over the past few days. His appetite is improving as is his energy level.     Objective: Blood pressure 133/76, pulse 74, temperature 96.9 F (36.1 C), temperature source Oral, resp. rate 17, height 5\' 11"  (1.803 m), weight 163 lb 8 oz (74.163 kg), SpO2 96.00%.  Patient is markedly hard of hearing. Oropharynx is without thrush or ulceration. Lungs are clear. No wheezes or rales. Regular cardiac rhythm. Abdomen soft and nontender. No hepatomegaly. Extremities are without edema. Calves nontender. He is alert and follows commands. He walks with a cane.  Lab Results: Lab Results  Component Value Date   WBC 3.2* 09/10/2013   HGB 13.3 09/10/2013   HCT 39.5 09/10/2013   MCV 90.3 09/10/2013   PLT 82* 09/10/2013    Chemistry:    Chemistry      Component Value Date/Time   NA 142 08/17/2013 0902   NA 138 04/18/2012 0924   NA 134* 11/18/2011 1100   K 4.3 08/17/2013 0902   K 4.5 04/18/2012 0924   K 4.4 11/18/2011 1100   CL 104 02/13/2013 1043   CL 97* 04/18/2012 0924   CL 101 11/18/2011 1100   CO2 25 08/17/2013 0902   CO2 29 04/18/2012 0924   CO2 20 11/18/2011 1100   BUN 20.2 08/17/2013 0902   BUN 18 04/18/2012 0924   BUN 40* 11/18/2011 1100   CREATININE 1.1 08/17/2013 0902   CREATININE 1.2 04/18/2012 0924   CREATININE 1.81* 11/18/2011 1100      Component Value Date/Time   CALCIUM 9.1 08/17/2013 0902   CALCIUM 9.0 04/18/2012 0924   CALCIUM 9.9 11/18/2011 1100    ALKPHOS 77 08/17/2013 0902   ALKPHOS 74 04/18/2012 0924   ALKPHOS 78 05/26/2010 0815   AST 17 08/17/2013 0902   AST 26 04/18/2012 0924   AST 15 05/26/2010 0815   ALT 8 08/17/2013 0902   ALT 19 04/18/2012 0924   ALT 10 05/26/2010 0815   BILITOT 0.69 08/17/2013 0902   BILITOT 0.80 04/18/2012 0924   BILITOT 0.4 05/26/2010 0815       Studies/Results: No results found.  Medications: I have reviewed the patient's current medications.  Assessment/Plan:  1. Metastatic gastrointestinal stromal tumor. Small bowel resection/partial colectomy 06/01/2010, 5.3 cm high-grade gastrointestinal stromal tumor.  17 mm left hepatic lobe lesion status post CT-guided biopsy on 05/15/2012 confirming metastatic gastrointestinal stromal tumor.  CT 02/13/2013 revealed new and enlarging liver metastases.  Initiation of Gleevec 200 mg daily 06/01/2013.  Gleevec placed on hold 07/09/2013 due to progressive hearing loss.  CT of the abdomen 07/30/2013 with a mild increase in the size of metastatic liver lesions compared to the 02/13/2013 CT  Initiation of daily Sutent on 08/04/2013. Sutent discontinued 09/08/2013 due to weakness and confusion. 2. Hearing deficit. Progressive since beginning Gleevec. Hearing is better since Gleevec was discontinued on 07/09/2013. 3. Fatigue/malaise. Progressive with Gleevec. Subsequent improvement. 4. Mild intermittent nausea. He has Zofran for as needed use. 5. Right leg edema. Negative Doppler  07/24/2013, positive for a Baker's cyst 6. Recent weakness and confusion. Question etiology. Improved coinciding with discontinuation of Sutent.  Disposition-Mr. Buch appears at baseline today. His son is concerned that the weakness and confusion are related to Sutent. He would like for him to remain off of Sutent for now. We will reevaluate in 3 weeks. His son will contact the office prior to that visit with further problems.  Plan reviewed with Dr. Truett Perna.  Lonna Cobb ANP/GNP-BC

## 2013-09-18 ENCOUNTER — Telehealth: Payer: Self-pay | Admitting: Medical Oncology

## 2013-09-18 NOTE — Telephone Encounter (Signed)
Called [facility] to hold sutent for now per pt request.

## 2013-10-02 ENCOUNTER — Other Ambulatory Visit: Payer: Medicare Other | Admitting: Lab

## 2013-10-02 ENCOUNTER — Ambulatory Visit (HOSPITAL_BASED_OUTPATIENT_CLINIC_OR_DEPARTMENT_OTHER): Payer: Medicare Other | Admitting: Nurse Practitioner

## 2013-10-02 ENCOUNTER — Telehealth: Payer: Self-pay | Admitting: Oncology

## 2013-10-02 VITALS — BP 113/60 | HR 47 | Temp 96.9°F | Resp 20 | Ht 71.0 in | Wt 163.7 lb

## 2013-10-02 DIAGNOSIS — R5381 Other malaise: Secondary | ICD-10-CM

## 2013-10-02 DIAGNOSIS — F23 Brief psychotic disorder: Secondary | ICD-10-CM

## 2013-10-02 DIAGNOSIS — C787 Secondary malignant neoplasm of liver and intrahepatic bile duct: Secondary | ICD-10-CM

## 2013-10-02 DIAGNOSIS — C49A0 Gastrointestinal stromal tumor, unspecified site: Secondary | ICD-10-CM

## 2013-10-02 DIAGNOSIS — C494 Malignant neoplasm of connective and soft tissue of abdomen: Secondary | ICD-10-CM

## 2013-10-02 LAB — CBC WITH DIFFERENTIAL/PLATELET
BASO%: 0.4 % (ref 0.0–2.0)
HCT: 37.9 % — ABNORMAL LOW (ref 38.4–49.9)
HGB: 12.7 g/dL — ABNORMAL LOW (ref 13.0–17.1)
LYMPH%: 37.8 % (ref 14.0–49.0)
MCH: 31.6 pg (ref 27.2–33.4)
MCHC: 33.4 g/dL (ref 32.0–36.0)
MCV: 94.4 fL (ref 79.3–98.0)
MONO%: 12.5 % (ref 0.0–14.0)
NEUT#: 2 10*3/uL (ref 1.5–6.5)
NEUT%: 46.5 % (ref 39.0–75.0)
Platelets: 110 10*3/uL — ABNORMAL LOW (ref 140–400)
RBC: 4.01 10*6/uL — ABNORMAL LOW (ref 4.20–5.82)
WBC: 4.3 10*3/uL (ref 4.0–10.3)

## 2013-10-02 NOTE — Telephone Encounter (Signed)
gv and printed papt sched and avs for pt for Jan 2015 °

## 2013-10-02 NOTE — Progress Notes (Signed)
OFFICE PROGRESS NOTE  Interval history:   Phillip Chapman returns for followup of metastatic gastrointestinal stromal tumor. He is feeling better. His son notes improvement in memory as well as appetite. He continues to note an alteration in taste. No nausea or vomiting. No diarrhea. No abdominal pain. Hearing deficit is unchanged.   Objective: Blood pressure 113/60, pulse 47, temperature 96.9 F (36.1 C), temperature source Oral, resp. rate 20, height 5\' 11"  (1.803 m), weight 163 lb 11.2 oz (74.254 kg).  No thrush. No palpable cervical or supraclavicular lymph nodes. Lungs are clear. No wheezes or rales. Distant heart sounds. Regular cardiac rhythm. Abdomen soft and nontender. No hepatomegaly. No mass. No leg edema. Calves soft and nontender. Significant hearing deficit.  Lab Results: Lab Results  Component Value Date   WBC 3.2* 09/10/2013   HGB 13.3 09/10/2013   HCT 39.5 09/10/2013   MCV 90.3 09/10/2013   PLT 82* 09/10/2013    Chemistry:    Chemistry      Component Value Date/Time   NA 142 08/17/2013 0902   NA 138 04/18/2012 0924   NA 134* 11/18/2011 1100   K 4.3 08/17/2013 0902   K 4.5 04/18/2012 0924   K 4.4 11/18/2011 1100   CL 104 02/13/2013 1043   CL 97* 04/18/2012 0924   CL 101 11/18/2011 1100   CO2 25 08/17/2013 0902   CO2 29 04/18/2012 0924   CO2 20 11/18/2011 1100   BUN 20.2 08/17/2013 0902   BUN 18 04/18/2012 0924   BUN 40* 11/18/2011 1100   CREATININE 1.1 08/17/2013 0902   CREATININE 1.2 04/18/2012 0924   CREATININE 1.81* 11/18/2011 1100      Component Value Date/Time   CALCIUM 9.1 08/17/2013 0902   CALCIUM 9.0 04/18/2012 0924   CALCIUM 9.9 11/18/2011 1100   ALKPHOS 77 08/17/2013 0902   ALKPHOS 74 04/18/2012 0924   ALKPHOS 78 05/26/2010 0815   AST 17 08/17/2013 0902   AST 26 04/18/2012 0924   AST 15 05/26/2010 0815   ALT 8 08/17/2013 0902   ALT 19 04/18/2012 0924   ALT 10 05/26/2010 0815   BILITOT 0.69 08/17/2013 0902   BILITOT 0.80 04/18/2012 0924   BILITOT 0.4  05/26/2010 0815       Studies/Results: No results found.  Medications: I have reviewed the patient's current medications.  Assessment/Plan:  1. Metastatic gastrointestinal stromal tumor. Small bowel resection/partial colectomy 06/01/2010, 5.3 cm high-grade gastrointestinal stromal tumor.  17 mm left hepatic lobe lesion status post CT-guided biopsy on 05/15/2012 confirming metastatic gastrointestinal stromal tumor.  CT 02/13/2013 revealed new and enlarging liver metastases.  Initiation of Gleevec 200 mg daily 06/01/2013.  Gleevec placed on hold 07/09/2013 due to progressive hearing loss.  CT of the abdomen 07/30/2013 with a mild increase in the size of metastatic liver lesions compared to the 02/13/2013 CT  Initiation of daily Sutent on 08/04/2013.  Sutent discontinued 09/08/2013 due to weakness and confusion. 2. Hearing deficit. Progressive since beginning Gleevec. Hearing is better since Gleevec was discontinued on 07/09/2013. 3. Fatigue/malaise. Progressive with Gleevec. Subsequent improvement. 4. Mild intermittent nausea. He has Zofran for as needed use. He denies nausea at today's visit. 5. Right leg edema. Negative Doppler 07/24/2013, positive for a Baker's cyst. 6. Recent weakness and confusion. Question etiology. Improved coinciding with discontinuation of Sutent.  Disposition-Phillip Chapman appears stable. We discussed resuming Sutent. Phillip Chapman and his son prefer he remain off of Sutent and the focus be on quality of life.   At  present he appears asymptomatic from the metastatic gastrointestinal stromal tumor. We discussed a hospice referral with plans to initiate with any decline in his condition.  Phillip Chapman and his son are agreeable with this plan. He will return for a followup visit in approximately 2 months.   Plan reviewed with Dr. Truett Perna.  Lonna Cobb ANP/GNP-BC

## 2013-11-04 ENCOUNTER — Other Ambulatory Visit: Payer: Self-pay | Admitting: Internal Medicine

## 2013-11-04 DIAGNOSIS — R4182 Altered mental status, unspecified: Secondary | ICD-10-CM

## 2013-11-13 ENCOUNTER — Ambulatory Visit
Admission: RE | Admit: 2013-11-13 | Discharge: 2013-11-13 | Disposition: A | Discharge status: Home / Self Care | Payer: Medicare Other | Source: Ambulatory Visit | Attending: Internal Medicine | Admitting: Internal Medicine

## 2013-11-13 DIAGNOSIS — R4182 Altered mental status, unspecified: Secondary | ICD-10-CM

## 2013-11-13 MED ORDER — GADOBENATE DIMEGLUMINE 529 MG/ML IV SOLN
15.0000 mL | Freq: Once | INTRAVENOUS | Status: AC | PRN
  Administered 2013-11-13: 15 mL via INTRAVENOUS

## 2013-11-20 ENCOUNTER — Other Ambulatory Visit: Payer: Self-pay | Admitting: *Deleted

## 2013-11-20 MED ORDER — LOSARTAN POTASSIUM 50 MG PO TABS: 50.0000 mg | ORAL_TABLET | Freq: Every day | ORAL | Status: AC

## 2013-11-27 ENCOUNTER — Ambulatory Visit (HOSPITAL_BASED_OUTPATIENT_CLINIC_OR_DEPARTMENT_OTHER): Payer: Medicare Other | Admitting: Oncology

## 2013-11-27 VITALS — BP 140/61 | HR 72 | Temp 97.2°F | Resp 18 | Ht 71.0 in | Wt 166.2 lb

## 2013-11-27 DIAGNOSIS — C494 Malignant neoplasm of connective and soft tissue of abdomen: Secondary | ICD-10-CM

## 2013-11-27 DIAGNOSIS — C787 Secondary malignant neoplasm of liver and intrahepatic bile duct: Secondary | ICD-10-CM

## 2013-11-27 DIAGNOSIS — C49A0 Gastrointestinal stromal tumor, unspecified site: Secondary | ICD-10-CM

## 2013-11-27 NOTE — Progress Notes (Signed)
   [place]    OFFICE PROGRESS NOTE   INTERVAL HISTORY:   Phillip Chapman returns with his son. The son reports Phillip Chapman has a good appetite. No pain. No difficulty with bowel function. The Foley catheter is changed monthly by the urologist. He continues close followup with Dr. Maudie Mercury.  Objective:  Vital signs in last 24 hours:  Blood pressure 140/61, pulse 72, temperature 97.2 F (36.2 C), temperature source Oral, resp. rate 18, height 5\' 11"  (1.803 m), weight 166 lb 3.2 oz (75.388 kg), SpO2 100.00%.    HEENT: Neck without mass Lymphatics: No cervical, supraclavicular, or inguinal nodes Resp: Lungs clear bilaterally Cardio: Regular rate and rhythm GI: Ventral hernia, no hepatomegaly, nontender, no mass Vascular: No leg edema   Lab Results:  Lab Results  Component Value Date   WBC 4.3 10/02/2013   HGB 12.7* 10/02/2013   HCT 37.9* 10/02/2013   MCV 94.4 10/02/2013   PLT 110* 10/02/2013   NEUTROABS 2.0 10/02/2013      Medications: I have reviewed the patient's current medications.  Assessment/Plan: 1. Metastatic gastrointestinal stromal tumor. Small bowel resection/partial colectomy 06/01/2010, 5.3 cm high-grade gastrointestinal stromal tumor.  17 mm left hepatic lobe lesion status post CT-guided biopsy on 05/15/2012 confirming metastatic gastrointestinal stromal tumor.  CT 02/13/2013 revealed new and enlarging liver metastases.  Initiation of Gleevec 200 mg daily 06/01/2013.  Gleevec placed on hold 07/09/2013 due to progressive hearing loss.  CT of the abdomen 07/30/2013 with a mild increase in the size of metastatic liver lesions compared to the 02/13/2013 CT  Initiation of daily Sutent on 08/04/2013.  Sutent discontinued 09/08/2013 due to weakness and confusion. 2. Hearing deficit. Progressive after beginning [place]. Hearing is better since Gleevec was discontinued on 07/09/2013. 3. Fatigue/malaise. Progressive with Gleevec. Subsequent  improvement. 4. Mild intermittent nausea. He has Zofran for as needed use. He denies nausea at today's visit. 5. History of Right leg edema. Negative Doppler 07/24/2013, positive for a Baker's cyst. 6. History of thrombocytopenia  Disposition:  Phillip Chapman appears stable. He does not appear to have symptoms related to the metastatic gastrointestinal stromal tumor. His son confirmed the plan to proceed with a comfort care approach. Hospice can be consulted when it becomes appropriate.  Phillip Chapman will continue medical care with Dr. Maudie Mercury. He is not scheduled for a followup appointment at the [facility]. I will be glad to see him in the future as needed.   Betsy Coder, MD  11/27/2013  9:10 AM

## 2014-01-06 ENCOUNTER — Inpatient Hospital Stay (HOSPITAL_COMMUNITY)
Admission: EM | Admit: 2014-01-06 | Discharge: 2014-01-09 | DRG: 542 | Disposition: A | Discharge status: Hospice | Payer: Medicare Other | Attending: Internal Medicine | Admitting: Internal Medicine

## 2014-01-06 ENCOUNTER — Encounter (HOSPITAL_COMMUNITY): Payer: Self-pay | Admitting: Emergency Medicine

## 2014-01-06 ENCOUNTER — Emergency Department (HOSPITAL_COMMUNITY): Payer: Medicare Other

## 2014-01-06 DIAGNOSIS — E119 Type 2 diabetes mellitus without complications: Secondary | ICD-10-CM | POA: Diagnosis present

## 2014-01-06 DIAGNOSIS — R1110 Vomiting, unspecified: Secondary | ICD-10-CM | POA: Diagnosis present

## 2014-01-06 DIAGNOSIS — K922 Gastrointestinal hemorrhage, unspecified: Secondary | ICD-10-CM

## 2014-01-06 DIAGNOSIS — F411 Generalized anxiety disorder: Secondary | ICD-10-CM | POA: Diagnosis present

## 2014-01-06 DIAGNOSIS — E860 Dehydration: Secondary | ICD-10-CM | POA: Diagnosis present

## 2014-01-06 DIAGNOSIS — N179 Acute kidney failure, unspecified: Secondary | ICD-10-CM | POA: Diagnosis present

## 2014-01-06 DIAGNOSIS — C787 Secondary malignant neoplasm of liver and intrahepatic bile duct: Secondary | ICD-10-CM | POA: Diagnosis present

## 2014-01-06 DIAGNOSIS — I129 Hypertensive chronic kidney disease with stage 1 through stage 4 chronic kidney disease, or unspecified chronic kidney disease: Secondary | ICD-10-CM | POA: Diagnosis present

## 2014-01-06 DIAGNOSIS — R0609 Other forms of dyspnea: Secondary | ICD-10-CM | POA: Diagnosis present

## 2014-01-06 DIAGNOSIS — M810 Age-related osteoporosis without current pathological fracture: Secondary | ICD-10-CM | POA: Diagnosis present

## 2014-01-06 DIAGNOSIS — Z515 Encounter for palliative care: Secondary | ICD-10-CM

## 2014-01-06 DIAGNOSIS — C49A0 Gastrointestinal stromal tumor, unspecified site: Secondary | ICD-10-CM | POA: Diagnosis present

## 2014-01-06 DIAGNOSIS — E785 Hyperlipidemia, unspecified: Secondary | ICD-10-CM | POA: Diagnosis present

## 2014-01-06 DIAGNOSIS — K661 Hemoperitoneum: Secondary | ICD-10-CM

## 2014-01-06 DIAGNOSIS — C799 Secondary malignant neoplasm of unspecified site: Secondary | ICD-10-CM

## 2014-01-06 DIAGNOSIS — D62 Acute posthemorrhagic anemia: Secondary | ICD-10-CM | POA: Diagnosis present

## 2014-01-06 DIAGNOSIS — C494 Malignant neoplasm of connective and soft tissue of abdomen: Principal | ICD-10-CM | POA: Diagnosis present

## 2014-01-06 DIAGNOSIS — H9190 Unspecified hearing loss, unspecified ear: Secondary | ICD-10-CM | POA: Diagnosis present

## 2014-01-06 DIAGNOSIS — R0989 Other specified symptoms and signs involving the circulatory and respiratory systems: Secondary | ICD-10-CM | POA: Diagnosis present

## 2014-01-06 DIAGNOSIS — R195 Other fecal abnormalities: Secondary | ICD-10-CM | POA: Diagnosis present

## 2014-01-06 DIAGNOSIS — R58 Hemorrhage, not elsewhere classified: Secondary | ICD-10-CM | POA: Diagnosis present

## 2014-01-06 DIAGNOSIS — R451 Restlessness and agitation: Secondary | ICD-10-CM

## 2014-01-06 DIAGNOSIS — N289 Disorder of kidney and ureter, unspecified: Secondary | ICD-10-CM

## 2014-01-06 DIAGNOSIS — N189 Chronic kidney disease, unspecified: Secondary | ICD-10-CM | POA: Diagnosis present

## 2014-01-06 DIAGNOSIS — Z66 Do not resuscitate: Secondary | ICD-10-CM | POA: Diagnosis present

## 2014-01-06 DIAGNOSIS — N390 Urinary tract infection, site not specified: Secondary | ICD-10-CM

## 2014-01-06 DIAGNOSIS — R0600 Dyspnea, unspecified: Secondary | ICD-10-CM

## 2014-01-06 DIAGNOSIS — R4182 Altered mental status, unspecified: Secondary | ICD-10-CM

## 2014-01-06 DIAGNOSIS — D649 Anemia, unspecified: Secondary | ICD-10-CM | POA: Diagnosis present

## 2014-01-06 DIAGNOSIS — Z9049 Acquired absence of other specified parts of digestive tract: Secondary | ICD-10-CM

## 2014-01-06 DIAGNOSIS — G9340 Encephalopathy, unspecified: Secondary | ICD-10-CM

## 2014-01-06 DIAGNOSIS — I4891 Unspecified atrial fibrillation: Secondary | ICD-10-CM | POA: Diagnosis present

## 2014-01-06 DIAGNOSIS — Z85038 Personal history of other malignant neoplasm of large intestine: Secondary | ICD-10-CM

## 2014-01-06 DIAGNOSIS — R52 Pain, unspecified: Secondary | ICD-10-CM

## 2014-01-06 LAB — PROTIME-INR
INR: 1.06 (ref 0.00–1.49)
Prothrombin Time: 13.6 seconds (ref 11.6–15.2)

## 2014-01-06 LAB — APTT: APTT: 30 s (ref 24–37)

## 2014-01-06 LAB — CBC
HEMATOCRIT: 27 % — AB (ref 39.0–52.0)
HEMOGLOBIN: 9 g/dL — AB (ref 13.0–17.0)
MCH: 29.1 pg (ref 26.0–34.0)
MCHC: 33.3 g/dL (ref 30.0–36.0)
MCV: 87.4 fL (ref 78.0–100.0)
Platelets: 151 10*3/uL (ref 150–400)
RBC: 3.09 MIL/uL — AB (ref 4.22–5.81)
RDW: 13.4 % (ref 11.5–15.5)
WBC: 10.2 10*3/uL (ref 4.0–10.5)

## 2014-01-06 LAB — COMPREHENSIVE METABOLIC PANEL
ALBUMIN: 3.3 g/dL — AB (ref 3.5–5.2)
ALT: 34 U/L (ref 0–53)
AST: 40 U/L — AB (ref 0–37)
Alkaline Phosphatase: 96 U/L (ref 39–117)
BILIRUBIN TOTAL: 0.4 mg/dL (ref 0.3–1.2)
BUN: 33 mg/dL — ABNORMAL HIGH (ref 6–23)
CHLORIDE: 101 meq/L (ref 96–112)
CO2: 26 mEq/L (ref 19–32)
CREATININE: 1.58 mg/dL — AB (ref 0.50–1.35)
Calcium: 9 mg/dL (ref 8.4–10.5)
GFR calc Af Amer: 43 mL/min — ABNORMAL LOW (ref >90)
GFR calc non Af Amer: 37 mL/min — ABNORMAL LOW (ref >90)
Glucose, Bld: 209 mg/dL — ABNORMAL HIGH (ref 70–99)
Potassium: 5.1 mEq/L (ref 3.7–5.3)
Sodium: 138 mEq/L (ref 137–147)
TOTAL PROTEIN: 5.8 g/dL — AB (ref 6.0–8.3)

## 2014-01-06 LAB — LIPASE, BLOOD: LIPASE: 22 U/L (ref 11–59)

## 2014-01-06 MED ORDER — IOHEXOL 300 MG/ML  SOLN
75.0000 mL | Freq: Once | INTRAMUSCULAR | Status: AC | PRN
  Administered 2014-01-06: 75 mL via INTRAVENOUS

## 2014-01-06 MED ORDER — LORAZEPAM 2 MG/ML IJ SOLN
1.0000 mg | Freq: Once | INTRAMUSCULAR | Status: AC
  Administered 2014-01-06: 1 mg via INTRAVENOUS

## 2014-01-06 MED ORDER — SODIUM CHLORIDE 0.9 % IV BOLUS (SEPSIS)
1000.0000 mL | Freq: Once | INTRAVENOUS | Status: AC
  Administered 2014-01-06: 1000 mL via INTRAVENOUS

## 2014-01-06 MED ORDER — LORAZEPAM 2 MG/ML IJ SOLN
INTRAMUSCULAR | Status: AC
  Administered 2014-01-06: 1 mg via INTRAVENOUS
  Filled 2014-01-06: qty 1

## 2014-01-06 NOTE — ED Notes (Signed)
Brought in by EMS from home with c/o abdominal pain.  Per EMS, pt's family reported that pt started having abdominal pain yesterday.  Pt was given Tramadol.  Pt has hx of liver cancer and has had tx which he completed 3 months ago.  Pt has had no pain until yesterday and today.  Pt also had episode of emesis and "dark, tarry stool" today.

## 2014-01-06 NOTE — Progress Notes (Signed)
   CARE MANAGEMENT ED NOTE 01/06/2014  Patient:  Phillip Chapman, Phillip Chapman   Account Number:  [id number]  Date Initiated:  01/06/2014  Documentation initiated by:  Livia Snellen  Subjective/Objective Assessment:   Patient presents to Ed with abdominal pain.     Subjective/Objective Assessment Detail:     Action/Plan:   Action/Plan Detail:   Anticipated DC Date:       Status Recommendation to Physician:   Result of Recommendation:    Other ED Services  Consult Working [place]  Other    Choice offered to / List presented to:            Status of service:  Completed, signed off  ED Comments:   ED Comments Detail:  EDCM spoke to patient and family at bedside.  Patient very hard of hearing, questions answered by patient's step son Phillip Chapman who reports is the patient's POA.  Patient lives at home with his 39 year old wife.  roger reports she is not in good health.  He reports patient's wife has home health services with [facility].  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  Patient does not have any one at nght time but patient's step son reports he is currently looking for someone.  Patient has 2 walkers and 2 canes a wheelchair and elevated toilet seat at home.  EDCM provided patient's family a list of home health agencies in [facility] and a list of private duty nursing services.  EDCM explained with home health, patient may receive a visiting RN, PT, OT an aise and social worker if needed.  Patient's family thankful for resources.  No further EDCM needs at this time.

## 2014-01-06 NOTE — ED Notes (Signed)
Bed: AY30 Expected date:  Expected time:  Means of arrival:  Comments: EMS 7M Liver CA/pain

## 2014-01-06 NOTE — ED Notes (Signed)
Pt's family at bedside at this time. Per family: pt has been observed to have "some confusion since yesterday"; pt started to have abdominal pain complaints which he never has done before. Pt was taken to his PCP's office today and has had his blood works done--- results still pending at this time.

## 2014-01-07 ENCOUNTER — Encounter (HOSPITAL_COMMUNITY): Payer: Self-pay | Admitting: Internal Medicine

## 2014-01-07 DIAGNOSIS — R4182 Altered mental status, unspecified: Secondary | ICD-10-CM

## 2014-01-07 DIAGNOSIS — G9340 Encephalopathy, unspecified: Secondary | ICD-10-CM | POA: Diagnosis present

## 2014-01-07 DIAGNOSIS — R0609 Other forms of dyspnea: Secondary | ICD-10-CM

## 2014-01-07 DIAGNOSIS — N289 Disorder of kidney and ureter, unspecified: Secondary | ICD-10-CM

## 2014-01-07 DIAGNOSIS — I4891 Unspecified atrial fibrillation: Secondary | ICD-10-CM | POA: Diagnosis present

## 2014-01-07 DIAGNOSIS — R0989 Other specified symptoms and signs involving the circulatory and respiratory systems: Secondary | ICD-10-CM

## 2014-01-07 DIAGNOSIS — K661 Hemoperitoneum: Secondary | ICD-10-CM | POA: Diagnosis present

## 2014-01-07 DIAGNOSIS — Z515 Encounter for palliative care: Secondary | ICD-10-CM

## 2014-01-07 DIAGNOSIS — D649 Anemia, unspecified: Secondary | ICD-10-CM | POA: Diagnosis present

## 2014-01-07 DIAGNOSIS — N179 Acute kidney failure, unspecified: Secondary | ICD-10-CM | POA: Diagnosis present

## 2014-01-07 DIAGNOSIS — R52 Pain, unspecified: Secondary | ICD-10-CM

## 2014-01-07 DIAGNOSIS — E119 Type 2 diabetes mellitus without complications: Secondary | ICD-10-CM | POA: Diagnosis present

## 2014-01-07 DIAGNOSIS — R0600 Dyspnea, unspecified: Secondary | ICD-10-CM

## 2014-01-07 LAB — COMPREHENSIVE METABOLIC PANEL WITH GFR
ALT: 31 U/L (ref 0–53)
AST: 37 U/L (ref 0–37)
Albumin: 3 g/dL — ABNORMAL LOW (ref 3.5–5.2)
Alkaline Phosphatase: 91 U/L (ref 39–117)
BUN: 23 mg/dL (ref 6–23)
CO2: 22 meq/L (ref 19–32)
Calcium: 8.8 mg/dL (ref 8.4–10.5)
Chloride: 103 meq/L (ref 96–112)
Creatinine, Ser: 1.29 mg/dL (ref 0.50–1.35)
GFR calc Af Amer: 55 mL/min — ABNORMAL LOW
GFR calc non Af Amer: 47 mL/min — ABNORMAL LOW
Glucose, Bld: 160 mg/dL — ABNORMAL HIGH (ref 70–99)
Potassium: 4 meq/L (ref 3.7–5.3)
Sodium: 138 meq/L (ref 137–147)
Total Bilirubin: 1.1 mg/dL (ref 0.3–1.2)
Total Protein: 5.8 g/dL — ABNORMAL LOW (ref 6.0–8.3)

## 2014-01-07 LAB — URINALYSIS, ROUTINE W REFLEX MICROSCOPIC
Bilirubin Urine: NEGATIVE
GLUCOSE, UA: NEGATIVE mg/dL
KETONES UR: NEGATIVE mg/dL
NITRITE: POSITIVE — AB
PH: 5 (ref 5.0–8.0)
Protein, ur: NEGATIVE mg/dL
SPECIFIC GRAVITY, URINE: 1.018 (ref 1.005–1.030)
Urobilinogen, UA: 0.2 mg/dL (ref 0.0–1.0)

## 2014-01-07 LAB — GLUCOSE, CAPILLARY
GLUCOSE-CAPILLARY: 175 mg/dL — AB (ref 70–99)
Glucose-Capillary: 180 mg/dL — ABNORMAL HIGH (ref 70–99)
Glucose-Capillary: 131 mg/dL — ABNORMAL HIGH (ref 70–99)

## 2014-01-07 LAB — CBC
HCT: 31.2 % — ABNORMAL LOW (ref 39.0–52.0)
HEMATOCRIT: 26.6 % — AB (ref 39.0–52.0)
Hemoglobin: 8.8 g/dL — ABNORMAL LOW (ref 13.0–17.0)
Hemoglobin: 10.8 g/dL — ABNORMAL LOW (ref 13.0–17.0)
MCH: 29 pg (ref 26.0–34.0)
MCH: 30.2 pg (ref 26.0–34.0)
MCHC: 33.1 g/dL (ref 30.0–36.0)
MCHC: 34.6 g/dL (ref 30.0–36.0)
MCV: 87.8 fL (ref 78.0–100.0)
MCV: 87.2 fL (ref 78.0–100.0)
PLATELETS: 120 10*3/uL — AB (ref 150–400)
Platelets: 133 10*3/uL — ABNORMAL LOW (ref 150–400)
RBC: 3.03 MIL/uL — AB (ref 4.22–5.81)
RBC: 3.58 MIL/uL — AB (ref 4.22–5.81)
RDW: 13.2 % (ref 11.5–15.5)
RDW: 13.4 % (ref 11.5–15.5)
WBC: 8.7 10*3/uL (ref 4.0–10.5)
WBC: 8.4 10*3/uL (ref 4.0–10.5)

## 2014-01-07 LAB — PREPARE RBC (CROSSMATCH)

## 2014-01-07 LAB — POC OCCULT BLOOD, ED: Fecal Occult Bld: POSITIVE — AB

## 2014-01-07 LAB — MRSA PCR SCREENING: MRSA by PCR: NEGATIVE

## 2014-01-07 LAB — AMMONIA: AMMONIA: 19 umol/L (ref 11–60)

## 2014-01-07 LAB — URINE MICROSCOPIC-ADD ON

## 2014-01-07 MED ORDER — DEXTROSE 5 % IV SOLN
1.0000 g | Freq: Once | INTRAVENOUS | Status: AC
  Administered 2014-01-07: 1 g via INTRAVENOUS
  Filled 2014-01-07: qty 10

## 2014-01-07 MED ORDER — SODIUM CHLORIDE 0.9 % IV SOLN
8.0000 mg/h | INTRAVENOUS | Status: DC
  Administered 2014-01-07: 8 mg/h via INTRAVENOUS
  Filled 2014-01-07 (×2): qty 80

## 2014-01-07 MED ORDER — SODIUM CHLORIDE 0.9 % IV SOLN
80.0000 mg | Freq: Once | INTRAVENOUS | Status: AC
  Administered 2014-01-07: 80 mg via INTRAVENOUS
  Filled 2014-01-07: qty 80

## 2014-01-07 MED ORDER — LORAZEPAM 2 MG/ML IJ SOLN: 1.5000 mg | INTRAMUSCULAR | Status: DC | PRN

## 2014-01-07 MED ORDER — INSULIN ASPART 100 UNIT/ML ~~LOC~~ SOLN
0.0000 [IU] | Freq: Three times a day (TID) | SUBCUTANEOUS | Status: DC
  Administered 2014-01-07: 2 [IU] via SUBCUTANEOUS

## 2014-01-07 MED ORDER — MORPHINE SULFATE 2 MG/ML IJ SOLN
2.0000 mg | INTRAMUSCULAR | Status: DC | PRN
  Administered 2014-01-07: 4 mg via INTRAVENOUS
  Administered 2014-01-08 (×2): 2 mg via INTRAVENOUS
  Administered 2014-01-08: 4 mg via INTRAVENOUS
  Administered 2014-01-08 – 2014-01-09 (×2): 2 mg via INTRAVENOUS
  Filled 2014-01-07: qty 1
  Filled 2014-01-07 (×6): qty 2

## 2014-01-07 MED ORDER — PIPERACILLIN-TAZOBACTAM 3.375 G IVPB
3.3750 g | Freq: Three times a day (TID) | INTRAVENOUS | Status: DC
  Administered 2014-01-07: 3.375 g via INTRAVENOUS
  Filled 2014-01-07 (×2): qty 50

## 2014-01-07 MED ORDER — HYDRALAZINE HCL 20 MG/ML IJ SOLN
10.0000 mg | INTRAMUSCULAR | Status: DC | PRN
  Administered 2014-01-07: 10 mg via INTRAVENOUS
  Filled 2014-01-07: qty 1

## 2014-01-07 MED ORDER — SODIUM CHLORIDE 0.9 % IV SOLN
INTRAVENOUS | Status: AC
  Administered 2014-01-07: 100 mL/h via INTRAVENOUS
  Administered 2014-01-07: 09:00:00 via INTRAVENOUS

## 2014-01-07 MED ORDER — LORAZEPAM 2 MG/ML IJ SOLN
1.0000 mg | INTRAMUSCULAR | Status: DC | PRN
  Administered 2014-01-07 (×2): 1 mg via INTRAVENOUS
  Filled 2014-01-07 (×2): qty 1

## 2014-01-07 MED ORDER — HALOPERIDOL LACTATE 5 MG/ML IJ SOLN
0.5000 mg | Freq: Four times a day (QID) | INTRAMUSCULAR | Status: DC | PRN
  Administered 2014-01-08: 0.5 mg via INTRAVENOUS
  Filled 2014-01-07: qty 1

## 2014-01-07 MED ORDER — SODIUM CHLORIDE 0.9 % IJ SOLN
3.0000 mL | Freq: Two times a day (BID) | INTRAMUSCULAR | Status: DC
  Administered 2014-01-07 – 2014-01-08 (×3): 3 mL via INTRAVENOUS

## 2014-01-07 MED ORDER — MORPHINE SULFATE 2 MG/ML IJ SOLN
1.0000 mg | INTRAMUSCULAR | Status: DC | PRN
  Administered 2014-01-07 (×3): 1 mg via INTRAVENOUS
  Filled 2014-01-07 (×3): qty 1

## 2014-01-07 MED ORDER — ONDANSETRON HCL 4 MG PO TABS: 4.0000 mg | ORAL_TABLET | Freq: Four times a day (QID) | ORAL | Status: DC | PRN

## 2014-01-07 MED ORDER — LORAZEPAM 2 MG/ML IJ SOLN: 0.5000 mg | INTRAMUSCULAR | Status: DC | PRN

## 2014-01-07 MED ORDER — PANTOPRAZOLE SODIUM 40 MG IV SOLR: 40.0000 mg | Freq: Two times a day (BID) | INTRAVENOUS | Status: DC

## 2014-01-07 MED ORDER — ONDANSETRON HCL 4 MG/2ML IJ SOLN: 4.0000 mg | Freq: Four times a day (QID) | INTRAMUSCULAR | Status: DC | PRN

## 2014-01-07 NOTE — Progress Notes (Signed)
[place]in[place], RN, BSN, CCM [phone] Chart Reviewed for discharge and hospital needs. Discharge needs at time of review:  None present will follow for needs. Review of patient progress due on [id number].

## 2014-01-07 NOTE — Progress Notes (Signed)
Patient admitted for occult blood in stool and abdominal pain.  Patient has Type 2 diabetes and on Metformin 500 mg daily at home.  Recommend changing Novolog SENSITIVE correction scale to every 4 hours when NPO and then TID when eating.  Will continue to follow while in hospital.  Harvel Ricks RN BSN CDE

## 2014-01-07 NOTE — Progress Notes (Signed)
Chaplain provided support with family at bedside in response to spiritual care consult / referral by palliative care.    Family aware of pt's prognosis.  Granddaughter and niece present.  Introduced spiritual care as resource and provided emotional support.  Will continue to follow to make contact with pt's wife and son.   [place], [place]

## 2014-01-07 NOTE — Progress Notes (Signed)
Patient seen and examined this morning, agree with H&P. Recheck HH after transfusion, consider IR consult. Will discuss case with Dr. Benay Spice. Palliative consult very appropriate and will discuss with family later today about code status, goals of care.   Toni Demo M. Cruzita Lederer, MD [facility] [phone]

## 2014-01-07 NOTE — ED Provider Notes (Addendum)
  Physical Exam  BP 135/63  Pulse 79  Temp(Src) 99.5 F (37.5 C) (Oral)  Resp 15  SpO2 94%  Physical Exam  ED Course  Procedures  MDM  Pt comes in with cc of confusion. Hx of cancer - colon, no active treatment. CT head is normal. UA is positive - will give ceftriaxone. Fluids ordered. Pt also has Hb drop, with occult + stools. Type and screen done.  Ct abd is pending. Will need admission.   Varney Biles, MD 01/07/14 0040  1:54 AM CT scan shows intraperitoneal bleeding, and intracapsular (liver) bleeding. Suspect that patient is having bleeding from his cancerous lesions. Spoke with Surgery, who deems that patient is unlikely to be a surgical candidate due to several reason. Might need IR consultation in the morning. No family at bedside, and no one picked up the phone when i called the # on file. Pt has persistent abd tenderness with some guarding. Abd is soft.  Medicine to admit. Surgery on consult.  CRITICAL CARE Performed by: Varney Biles   Total critical care time: 40 minutes - active bleeding, with acute anemia and altered mental status with infection.  Critical care time was exclusive of separately billable procedures and treating other patients.  Critical care was necessary to treat or prevent imminent or life-threatening deterioration.  Critical care was time spent personally by me on the following activities: development of treatment plan with patient and/or surrogate as well as nursing, discussions with consultants, evaluation of patient's response to treatment, examination of patient, obtaining history from patient or surrogate, ordering and performing treatments and interventions, ordering and review of laboratory studies, ordering and review of radiographic studies, pulse oximetry and re-evaluation of patient's condition.   Varney Biles, MD 01/07/14 [phone]

## 2014-01-07 NOTE — ED Provider Notes (Signed)
CSN: [id number]     Arrival date & time 01/06/14  2004 History   First MD Initiated Contact with Patient 01/06/14 2010     Chief Complaint  Patient presents with  . Abdominal Pain     (Consider location/radiation/quality/duration/timing/severity/associated sxs/prior Treatment) HPI A LEVEL 5 CAVEAT PERTAINS DUE TO ALTERED MENTAL STATUS Pt presents with altered mental status over the past 2 days.  Per family they state that he has become more confused, does not know where he is living, has not been eating well and began to complain of abdominal pain.  He has hx of colon cancer with liver mets.  He was taken off chemo 3 months ago as this was not helping.  Family states there is no futher treatment planned for his cancer.  He is not under hospice care at this time as he has not been c/o pain until yesterday.  No fever, no vomiting.  Did have one dark black stool yesterday.  No bright red blood per rectum.   Past Medical History  Diagnosis Date  . Blood in stool april 2011  . Hypertension   . IBS (irritable bowel syndrome)   . Glaucoma   . Stroke   . Chronic kidney disease   . Diabetes mellitus   . Wears dentures   . Arthritis   . Hyperlipidemia   . Osteoporosis   . Hearing loss   . Nasal congestion   . Difficulty urinating   . Arthritis pain   . Weakness   . Confusion   . Cancer     colon  . Metastatic cancer to liver 05/15/2012   Past Surgical History  Procedure Laterality Date  . Hernia repair    . Retinal detachment surgery  2010  . Colon surgery  2010  . Leg surgery      due to saw mill accident   Family History  Problem Relation Age of Onset  . Cancer Brother   . Heart disease Brother    History  Substance Use Topics  . Smoking status: Former Smoker    Quit date: 12/09/1981  . Smokeless tobacco: Never Used  . Alcohol Use: No    Review of Systems UNABLE TO OBTAIN ROS DUE TO LEVEL 5 CAVEAT    Allergies  Codeine and 3p nol  Home Medications   No  current outpatient prescriptions on file. BP 170/80  Pulse 70  Temp(Src) 98.4 F (36.9 C) (Axillary)  Resp 18  Ht 5\' 11"  (1.803 m)  Wt 166 lb 14.2 oz (75.7 kg)  BMI 23.29 kg/m2  SpO2 95% Vitals reviewed Physical Exam Physical Examination: General appearance - somnolent, arousable to voice, chronically ill appearing, and in no distress Mental status - alert, oriented to person, not to place or time Eyes - pupils equal and reactive, no scleral icterus, no conjunctival injection Mouth - mucous membranes dry, OP clear Chest - clear to auscultation, no wheezes, rales or rhonchi, symmetric air entry Heart - normal rate, regular rhythm, normal S1, S2, no murmurs, rubs, clicks or gallops Abdomen - soft, nondistended, no masses or organomegaly, ttp diffusely over abdomen, no gaurding or rebound Neuro- sleeping but alert after aroused, oriented to self only, moving all extremities, answering questions very slowly Extremities - peripheral pulses normal, no pedal edema, no clubbing or cyanosis Skin - normal coloration and turgor, no rashes  ED Course  Procedures (including critical care time) Labs Review Labs Reviewed  CBC - Abnormal; Notable for the following:    RBC  3.09 (*)    Hemoglobin 9.0 (*)    HCT 27.0 (*)    All other components within normal limits  COMPREHENSIVE METABOLIC PANEL - Abnormal; Notable for the following:    Glucose, Bld 209 (*)    BUN 33 (*)    Creatinine, Ser 1.58 (*)    Total Protein 5.8 (*)    Albumin 3.3 (*)    AST 40 (*)    GFR calc non Af Amer 37 (*)    GFR calc Af Amer 43 (*)    All other components within normal limits  URINALYSIS, ROUTINE W REFLEX MICROSCOPIC - Abnormal; Notable for the following:    APPearance CLOUDY (*)    Hgb urine dipstick TRACE (*)    Nitrite POSITIVE (*)    Leukocytes, UA LARGE (*)    All other components within normal limits  URINE MICROSCOPIC-ADD ON - Abnormal; Notable for the following:    Bacteria, UA MANY (*)    All  other components within normal limits  CBC - Abnormal; Notable for the following:    RBC 3.03 (*)    Hemoglobin 8.8 (*)    HCT 26.6 (*)    Platelets 133 (*)    All other components within normal limits  COMPREHENSIVE METABOLIC PANEL - Abnormal; Notable for the following:    Glucose, Bld 160 (*)    Total Protein 5.8 (*)    Albumin 3.0 (*)    GFR calc non Af Amer 47 (*)    GFR calc Af Amer 55 (*)    All other components within normal limits  CBC - Abnormal; Notable for the following:    RBC 3.58 (*)    Hemoglobin 10.8 (*)    HCT 31.2 (*)    Platelets 120 (*)    All other components within normal limits  GLUCOSE, CAPILLARY - Abnormal; Notable for the following:    Glucose-Capillary 180 (*)    All other components within normal limits  GLUCOSE, CAPILLARY - Abnormal; Notable for the following:    Glucose-Capillary 175 (*)    All other components within normal limits  GLUCOSE, CAPILLARY - Abnormal; Notable for the following:    Glucose-Capillary 131 (*)    All other components within normal limits  POC OCCULT BLOOD, ED - Abnormal; Notable for the following:    Fecal Occult Bld POSITIVE (*)    All other components within normal limits  MRSA PCR SCREENING  URINE CULTURE  LIPASE, BLOOD  PROTIME-INR  APTT  AMMONIA  TYPE AND SCREEN  PREPARE RBC (CROSSMATCH)   Imaging Review Ct Head Wo Contrast  01/07/2014   CLINICAL DATA Altered mental status.  EXAM CT HEAD WITHOUT CONTRAST  TECHNIQUE Contiguous axial images were obtained from the base of the skull through the vertex without intravenous contrast.  COMPARISON CT scan of January 18, 2011.  FINDINGS Bony calvarium appears intact. Mild diffuse cortical atrophy is noted. Chronic ischemic white matter disease is noted. No mass effect or midline shift is noted. Ventricular size is within normal limits. There is no evidence of mass lesion, hemorrhage or acute infarction.  IMPRESSION Diffuse cortical atrophy. Chronic ischemic white matter  disease. No acute intracranial abnormality seen.  SIGNATURE  Electronically Signed   By: Sabino Dick M.D.   On: 01/07/2014 00:25   Ct Abdomen Pelvis W Contrast  01/07/2014   CLINICAL DATA Abdominal pain  EXAM CT ABDOMEN AND PELVIS WITH CONTRAST  TECHNIQUE Multidetector CT imaging of the abdomen and pelvis was  performed using the standard protocol following bolus administration of intravenous contrast.  CONTRAST 78mL OMNIPAQUE IOHEXOL 300 MG/ML  SOLN  COMPARISON 07/30/2013  FINDINGS Normal heart size. Coronary artery and aortic valvular calcifications. Bibasilar opacities.  Multifocal liver lesions, increased in size in the interval. The largest is within the hepatic dome measuring 5.5 x 7.0 cm, which abuts the liver capsule. Increased intralesional heterogeneity. Within segment 6, there is a 3 cm lesion abutting the liver capsule with suggestion of extension through the capsule on image 29 series 2. Interval development of a perihepatic mixed attenuation as well as high attenuation perisplenic and dependently within the pelvis.  Layering gallstones.  No biliary ductal dilatation.  Atrophy of the pancreas. Unremarkable spleen and adrenal glands. Multiple renal cysts and incompletely characterized hypodensities. No hydroureteronephrosis.  Anterior abdominal wall laxity. Colonic diverticulosis. No overt colitis or diverticulitis. Right hemicolectomy with ileocolonic anastomosis. No bowel obstruction. No free intraperitoneal air. Small hiatal hernia. No lymphadenopathy.  Advanced atherosclerotic disease of the aorta and branch vessels. No aneurysmal dilatation.  Bladder wall thickening. Foley catheter in place. Multiple bladder diverticula.  Osteopenia and multilevel degenerative changes.  IMPRESSION Multifocal liver lesions, some of which have increased in size and complexity.  The dominant mass at the hepatic dome shows intralesional high attenuation, most in keeping with hemorrhage. There is extracapsular  extension, with a small to moderate amount of intraperitoneal blood collecting perihepatic, perisplenic, and dependently within the pelvis.  Segment 6 lesion also appears to extend to or through the liver capsule.  Bibasilar opacities; atelectasis, aspiration, or infiltrate.  Bladder wall thickening may reflect cystitis. Correlate with urinalysis.  Other nonemergent findings are similar to prior as above.  Critical Value/emergent results were called by telephone at the time of interpretation on 01/07/2014 at 12:40 AM to Dr. Kathrynn Humble, who verbally acknowledged these results.  SIGNATURE  Electronically Signed   By: Carlos Levering M.D.   On: 01/07/2014 00:48     EKG Interpretation   Date/Time:  Wednesday January 06 2014 20:23:27 EDT Ventricular Rate:  80 PR Interval:  211 QRS Duration: 80 QT Interval:  358 QTC Calculation: 413 R Axis:   43 Text Interpretation:  Sinus rhythm Atrial premature complexes in couplets  Since previous tracing afib/flutter is no longer present Confirmed by  Canary Brim  MD, Keener [id number]) on 01/06/2014 11:52:22 PM      MDM   Final diagnoses:  Altered mental status  Metastatic cancer  Renal insufficiency  Anemia  GI bleed  Intraperitoneal bleeding  UTI (lower urinary tract infection)  Altered mental state    Pt presenting with c/o altered mental status and abdominal pain.  Pt has hx of colon cancer with liver mets and began to c/o abdominal pain yesterday as well as decreased level of alertness.  Pt was "taken off" chemo per family 3 months ago due to no response.  No further treatment is planned for his cancer.  He is not yet on hospice.  Labs reveal, anemia compared to his baseline, heme positive stool, renal insufficiency.  Awaiting abdominal and head CT as well as urine and ammonia.  Pt will need to be admitted pending these studies.      Threasa Beards, MD 01/07/14 1600

## 2014-01-07 NOTE — H&P (Signed)
[facility] History and Physical  MICKAL MENO WUX:[id number] DOB: [birth year] DOA: 01/06/2014  Referring physician: ER physician. PCP: Jani Gravel, MD  Specialists: Dr. Ammie Dalton. Oncologist.  Chief Complaint: Abdominal pain and confusion.  HPI: Phillip Chapman is a 80 y.o. male with history of metastatic GI stromal tumor, diabetes mellitus, atrial fibrillation, hypertension was brought to the ER because of increasing confusion and abdominal pain. Patient's son who provided most of the history states that patient has been increasingly confused over the last 2 months but last one week his confusion has worsened. Yesterday patient started having abdominal pain and had in addition vomiting. In the ER patient had CT head which did not show anything acute since patient complain of abdominal pain a CT abdomen and pelvis was done which shows intra-lesional hemorrhage in the liver and intraperitoneal bleed. In addition patient is also stool for occult blood positive. Patient is mildly febrile and UA shows features consistent with UTI. At this time on call surgeon Dr. Excell Seltzer was consulted by ER physician as per surgery patient is not a candidate for any surgical intervention. Surgery is recommended interventional radiology. ER physician or his cousin interventional radiology and if patient continues to show any further bleeding interventional radiologist may need to be consulted for possible embolization. Patient presently on my exam is confused and does not provide any history.   Review of Systems: As presented in the history of presenting illness, rest negative.  Past Medical History  Diagnosis Date  . Blood in stool april 2011  . Hypertension   . IBS (irritable bowel syndrome)   . Glaucoma   . Stroke   . Chronic kidney disease   . Diabetes mellitus   . Wears dentures   . Arthritis   . Hyperlipidemia   . Osteoporosis   . Hearing loss   . Nasal congestion   . Difficulty urinating   .  Arthritis pain   . Weakness   . Confusion   . Cancer     colon  . Metastatic cancer to liver 05/15/2012   Past Surgical History  Procedure Laterality Date  . Hernia repair    . Retinal detachment surgery  2010  . Colon surgery  2010  . Leg surgery      due to saw mill accident   Social History:  reports that he quit smoking about 32 years ago. He has never used smokeless tobacco. He reports that he does not drink alcohol or use illicit drugs. Where does patient live home. Can patient participate in ADLs? Not sure.  Allergies  Allergen Reactions  . Codeine Nausea And Vomiting  . 3p Nol [Acetaminophen] Other (See Comments)    [name], pt's step son, states pt takes tylenol now. States he had reaction to tylenol with codeine . It was codeine which made pt sick.    Family History:  Family History  Problem Relation Age of Onset  . Cancer Brother   . Heart disease Brother       Prior to Admission medications   Medication Sig Start Date End Date Taking? Authorizing Provider  aspirin EC 81 MG tablet Take 81 mg by mouth daily.    Historical Provider, MD  b complex vitamins capsule Take 1 capsule by mouth daily.    Historical Provider, MD  donepezil (ARICEPT) 10 MG tablet Take 10 mg by mouth at bedtime.    Historical Provider, MD  dutasteride (AVODART) 0.5 MG capsule Take 0.5 mg by mouth daily.  Historical Provider, MD  ferrous sulfate 325 (65 FE) MG tablet Take 325 mg by mouth daily with breakfast.     Historical Provider, MD  fish oil-omega-3 fatty acids 1000 MG capsule Take 1 g by mouth daily.    Historical Provider, MD  loratadine (CLARITIN) 10 MG tablet Take 10 mg by mouth daily.    Historical Provider, MD  losartan (COZAAR) 50 MG tablet Take 1 tablet (50 mg total) by mouth daily. Need appointment before anymore refils 11/20/13   Lorretta Harp, MD  metFORMIN (GLUMETZA) 500 MG (MOD) 24 hr tablet Take 500 mg by mouth daily with breakfast.     Historical Provider, MD   metoprolol succinate (TOPROL-XL) 25 MG 24 hr tablet Take 25 mg by mouth daily.    Historical Provider, MD  montelukast (SINGULAIR) 10 MG tablet Take 10 mg by mouth at bedtime.    Historical Provider, MD  Nystatin [facility]) 100000 UNIT/GM POWD  08/27/13   Historical Provider, MD  omeprazole (PRILOSEC) 20 MG capsule Take 20 mg by mouth daily.    Historical Provider, MD  ondansetron (ZOFRAN) 4 MG tablet Take 4 mg by mouth every 8 (eight) hours as needed for nausea.    Historical Provider, MD  polyethylene glycol (MIRALAX / GLYCOLAX) packet Take 17 g by mouth daily.    Historical Provider, MD  pravastatin (PRAVACHOL) 20 MG tablet Take 20 mg by mouth daily.      Historical Provider, MD  PRESCRIPTION MEDICATION Place 1 drop into both eyes as needed. alrex    Historical Provider, MD  tamsulosin (FLOMAX) 0.4 MG CAPS Take 0.4 mg by mouth daily. 02/11/13   Historical Provider, MD    Physical Exam: Filed Vitals:   01/06/14 2025  BP: 135/63  Pulse: 79  Temp: 99.5 F (37.5 C)  TempSrc: Oral  Resp: 15  SpO2: 94%     General:  Well-developed and moderately nourished.  Eyes: Anicteric no pallor.  ENT: No discharge from the ears eyes nose mouth.  Neck: No mass felt.  Cardiovascular: S1-S2 heard.  Respiratory: No rhonchi or crepitations.  Abdomen: Diffuse tenderness present. No rigidity.  Skin: No rash.  Musculoskeletal: No edema.  Psychiatric: Patient is confused.  Neurologic: Patient is confused and does not follow commands.  Labs on Admission:  Basic Metabolic Panel:  Recent Labs Lab 01/06/14 2156  NA 138  K 5.1  CL 101  CO2 26  GLUCOSE 209*  BUN 33*  CREATININE 1.58*  CALCIUM 9.0   Liver Function Tests:  Recent Labs Lab 01/06/14 2156  AST 40*  ALT 34  ALKPHOS 96  BILITOT 0.4  PROT 5.8*  ALBUMIN 3.3*    Recent Labs Lab 01/06/14 2156  LIPASE 22    Recent Labs Lab 01/06/14 2340  AMMONIA 19   CBC:  Recent Labs Lab 01/06/14 2156  WBC 10.2  HGB  9.0*  HCT 27.0*  MCV 87.4  PLT 151   Cardiac Enzymes: No results found for this basename: CKTOTAL, CKMB, CKMBINDEX, TROPONINI,  in the last 168 hours  BNP (last 3 results) No results found for this basename: PROBNP,  in the last 8760 hours CBG: No results found for this basename: GLUCAP,  in the last 168 hours  Radiological Exams on Admission: Ct Head Wo Contrast  01/07/2014   CLINICAL DATA Altered mental status.  EXAM CT HEAD WITHOUT CONTRAST  TECHNIQUE Contiguous axial images were obtained from the base of the skull through the vertex without intravenous contrast.  COMPARISON  CT scan of January 18, 2011.  FINDINGS Bony calvarium appears intact. Mild diffuse cortical atrophy is noted. Chronic ischemic white matter disease is noted. No mass effect or midline shift is noted. Ventricular size is within normal limits. There is no evidence of mass lesion, hemorrhage or acute infarction.  IMPRESSION Diffuse cortical atrophy. Chronic ischemic white matter disease. No acute intracranial abnormality seen.  SIGNATURE  Electronically Signed   By: Sabino Dick M.D.   On: 01/07/2014 00:25   Ct Abdomen Pelvis W Contrast  01/07/2014   CLINICAL DATA Abdominal pain  EXAM CT ABDOMEN AND PELVIS WITH CONTRAST  TECHNIQUE Multidetector CT imaging of the abdomen and pelvis was performed using the standard protocol following bolus administration of intravenous contrast.  CONTRAST 75mL OMNIPAQUE IOHEXOL 300 MG/ML  SOLN  COMPARISON 07/30/2013  FINDINGS Normal heart size. Coronary artery and aortic valvular calcifications. Bibasilar opacities.  Multifocal liver lesions, increased in size in the interval. The largest is within the hepatic dome measuring 5.5 x 7.0 cm, which abuts the liver capsule. Increased intralesional heterogeneity. Within segment 6, there is a 3 cm lesion abutting the liver capsule with suggestion of extension through the capsule on image 29 series 2. Interval development of a perihepatic mixed attenuation  as well as high attenuation perisplenic and dependently within the pelvis.  Layering gallstones.  No biliary ductal dilatation.  Atrophy of the pancreas. Unremarkable spleen and adrenal glands. Multiple renal cysts and incompletely characterized hypodensities. No hydroureteronephrosis.  Anterior abdominal wall laxity. Colonic diverticulosis. No overt colitis or diverticulitis. Right hemicolectomy with ileocolonic anastomosis. No bowel obstruction. No free intraperitoneal air. Small hiatal hernia. No lymphadenopathy.  Advanced atherosclerotic disease of the aorta and branch vessels. No aneurysmal dilatation.  Bladder wall thickening. Foley catheter in place. Multiple bladder diverticula.  Osteopenia and multilevel degenerative changes.  IMPRESSION Multifocal liver lesions, some of which have increased in size and complexity.  The dominant mass at the hepatic dome shows intralesional high attenuation, most in keeping with hemorrhage. There is extracapsular extension, with a small to moderate amount of intraperitoneal blood collecting perihepatic, perisplenic, and dependently within the pelvis.  Segment 6 lesion also appears to extend to or through the liver capsule.  Bibasilar opacities; atelectasis, aspiration, or infiltrate.  Bladder wall thickening may reflect cystitis. Correlate with urinalysis.  Other nonemergent findings are similar to prior as above.  Critical Value/emergent results were called by telephone at the time of interpretation on 01/07/2014 at 12:40 AM to Dr. Kathrynn Humble, who verbally acknowledged these results.  SIGNATURE  Electronically Signed   By: Carlos Levering M.D.   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 Intraperitoneal and  intra liver lesion bleed with known history of metastatic GI stromal tumor - at this time patient has been placed on 2 units of packed red blood cell transfusion as patient's hemoglobin has decreased by 3 g since last December. Recheck CBC after transfusion and if there is evidence of active bleeding then we may have to consult interventional radiology for possible embolization. Continue with Protonix infusion has possible GI bleed. Also consult patient's oncologist Dr. Learta Codding in a.m. 2. Acute encephalopathy - cause not clear could be multifactorial including UTI, metabolic and pain related. If confusion persist may need MRI  brain. 3. UTI - at this time I have placed patient on Zosyn. Follow cultures. 4. Acute renal failure probably from dehydration - closely follow intake output and metabolic panel. 5. Acute blood loss anemia - see #1. 6. Diabetes mellitus type 2 - patient is n.p.o. and is placed on sliding-scale coverage. 7. History of hypertension - holding off all antihypertensives as patient is n.p.o. and when necessary IV hydralazine for systolic blood pressure more than 180. 8. History of atrial fibrillation. . I have discussed with on-call pulmonary critical care about the patient's admission. I have discussed with patient's son about the CODE STATUS. At this time patient's son wants patient to be a full code but he will be discussing with family and decide further on the code in a.m. I have consulted palliative care for goals of care.  Code Status: DO NOT RESUSCITATE.  Family Communication: Patient's son.  Disposition Plan: Admit to inpatient.    Lincon Sahlin N. [facility] Pager [phone].  If 7PM-7AM, please contact night-coverage [email or web address] Password [email or web address] 01/07/2014, 2:43 AM

## 2014-01-07 NOTE — Consult Note (Signed)
Patient Phillip Chapman      DOB: 1921/06/10      [id number]     Consult Note from the Palliative Medicine Team at [facility] Requested by: Dr Cruzita Lederer     PCP: Jani Gravel, MD Reason for Consultation:  Clarification of [place] and options    Phone Number:[phone]  Assessment of patients Current state: Phillip Chapman is a 80 y.o. male with history of metastatic GI stromal tumor, diabetes mellitus, atrial fibrillation, hypertension was brought to the ER because of increasing confusion and abdominal pain. Patient's son who provided most of the history states that patient has been increasingly confused over the last 2 months but last one week his confusion has worsened. Yesterday patient started having abdominal pain and had in addition vomiting. In the ER patient had CT head which did not show anything acute since patient complain of abdominal pain a CT abdomen and pelvis was done which shows intra-lesional hemorrhage in the liver and intraperitoneal bleed. In addition patient is also stool for occult blood positive.  Family faced with advanced directive decisions and anticipatory care needs    Consult is for review of medical treatment options, clarification of goals of care and end of life issues, disposition and options, and symptom recommendation.  This NP Wadie Lessen reviewed medical records, received report from team, assessed the patient and then meet at the patient's bedside along with his step-son/hpoa Phillip Chapman  # [id number]  to discuss diagnosis prognosis, GOC, EOL wishes disposition and options.   A detailed discussion was had today regarding advanced directives.  Concepts specific to code status, artifical feeding and hydration, continued IV antibiotics and rehospitalization was had.  The difference between a aggressive medical intervention path  and a palliative comfort care path for this patient at this time was had.  Values and goals of care important to  patient and family were attempted to be elicited.  Concept of Hospice and Palliative Care were discussed  Natural trajectory and expectations at EOL were discussed.  Questions and concerns addressed.  Hard Choices booklet left for review. Family encouraged to call with questions or concerns.  PMT will continue to support holistically.  Goals of Care: 1.  Code Status:  DNR/DNI--comfort is focus of care   2. Scope of Treatment: 1. Vital Signs: daily  2. Respiratory/Oxygen:for comfort only 3. Nutritional Support/Tube Feeds:no artificial feeding now or in the future 4. Antibiotics: no 5. Blood Products: no 6. IVF:  KVO  7. Review of Medications to be discontinued: minimize to comfort 8. Labs:none 9. Telemetry:none 10. Consults:none   4. Disposition:  Evaluate in the morning, if it makes sense family is interested in inpatient hospice facility   3. Symptom Management:   1. Anxiety/Agitation: Ativan 1 mg IV every 4 hrs prn 2. Pain/Dyspnea: Morphine 1 mg IV every 1 hr prn  4. Psychosocial:  Emotional support offered to family.  Hope is for comfort and dignity.  5. Spiritual: Chaplain consulted   Patient Documents Completed or Given: Document Given Completed  Advanced Directives Pkt    MOST  yes  DNR    Gone from My Sight    Hard Choices      Brief HPI: Phillip Chapman is a 80 y.o. male with history of metastatic GI stromal tumor, diabetes mellitus, atrial fibrillation, hypertension was brought to the ER because of increasing confusion and abdominal pain. Patient's son who provided most of the history states that patient has  been increasingly confused over the last 2 months but last one week his confusion has worsened. Yesterday patient started having abdominal pain and had in addition vomiting. In the ER patient had CT head which did not show anything acute since patient complain of abdominal pain a CT abdomen and pelvis was done which shows intra-lesional hemorrhage in the  liver and intraperitoneal bleed. In addition patient is also stool for occult blood positive.   ROS: unable to illicit due to decreased cognition  PMH:  Past Medical History  Diagnosis Date  . Blood in stool april 2011  . Hypertension   . IBS (irritable bowel syndrome)   . Glaucoma   . Stroke   . Chronic kidney disease   . Diabetes mellitus   . Wears dentures   . Arthritis   . Hyperlipidemia   . Osteoporosis   . Hearing loss   . Nasal congestion   . Difficulty urinating   . Arthritis pain   . Weakness   . Confusion   . Cancer     colon  . Metastatic cancer to liver 05/15/2012     PSH: Past Surgical History  Procedure Laterality Date  . Hernia repair    . Retinal detachment surgery  2010  . Colon surgery  2010  . Leg surgery      due to saw mill accident   I have reviewed the [place] and SH and  If appropriate update it with new information. Allergies  Allergen Reactions  . Codeine Nausea And Vomiting  . 3p Nol [Acetaminophen] Other (See Comments)    Phillip Chapman, pt's step son, states pt takes tylenol now. States he had reaction to tylenol with codeine . It was codeine which made pt sick.   Scheduled Meds: . sodium chloride  3 mL Intravenous Q12H   Continuous Infusions: . sodium chloride 100 mL/hr at 01/07/14 0856   PRN Meds:.LORazepam, morphine injection, ondansetron (ZOFRAN) IV    BP 170/80  Pulse 70  Temp(Src) 97.7 F (36.5 C) (Axillary)  Resp 18  Ht 5\' 11"  (1.803 m)  Wt 75.7 kg (166 lb 14.2 oz)  BMI 23.29 kg/m2  SpO2 95%   PPS:20%   Intake/Output Summary (Last 24 hours) at 01/07/14 1007 Last data filed at 01/07/14 0515  Gross per 24 hour  Intake 1842.5 ml  Output   1050 ml  Net  792.5 ml    Physical Exam:  General: ill appearing, agitated, confused, unable to follow commands HEENT:  Mm, no exudate Chest:  CTA CVS: RRR Abdomen: soft NT decreased BS Ext: without edema   Labs: CBC    Component Value Date/Time   WBC 8.7 01/07/2014 0225    WBC 4.3 10/02/2013 0852   RBC 3.03* 01/07/2014 0225   RBC 4.01* 10/02/2013 0852   HGB 8.8* 01/07/2014 0225   HGB 12.7* 10/02/2013 0852   HCT 26.6* 01/07/2014 0225   HCT 37.9* 10/02/2013 0852   PLT 133* 01/07/2014 0225   PLT 110* 10/02/2013 0852   MCV 87.8 01/07/2014 0225   MCV 94.4 10/02/2013 0852   MCH 29.0 01/07/2014 0225   MCH 31.6 10/02/2013 0852   MCHC 33.1 01/07/2014 0225   MCHC 33.4 10/02/2013 0852   RDW 13.2 01/07/2014 0225   RDW 16.7* 10/02/2013 0852   LYMPHSABS 1.6 10/02/2013 0852   LYMPHSABS 1.1 11/18/2011 1100   MONOABS 0.5 10/02/2013 0852   MONOABS 1.0 11/18/2011 1100   EOSABS 0.1 10/02/2013 0852   EOSABS 0.0 11/18/2011 1100  BASOSABS 0.0 10/02/2013 0852   BASOSABS 0.0 11/18/2011 1100    BMET    Component Value Date/Time   NA 138 01/06/2014 2156   NA 142 08/17/2013 0902   NA 138 04/18/2012 0924   K 5.1 01/06/2014 2156   K 4.3 08/17/2013 0902   K 4.5 04/18/2012 0924   CL 101 01/06/2014 2156   CL 104 02/13/2013 1043   CL 97* 04/18/2012 0924   CO2 26 01/06/2014 2156   CO2 25 08/17/2013 0902   CO2 29 04/18/2012 0924   GLUCOSE 209* 01/06/2014 2156   GLUCOSE 150* 08/17/2013 0902   GLUCOSE 141* 02/13/2013 1043   GLUCOSE 160* 04/18/2012 0924   BUN 33* 01/06/2014 2156   BUN 20.2 08/17/2013 0902   BUN 18 04/18/2012 0924   CREATININE 1.58* 01/06/2014 2156   CREATININE 1.1 08/17/2013 0902   CREATININE 1.2 04/18/2012 0924   CALCIUM 9.0 01/06/2014 2156   CALCIUM 9.1 08/17/2013 0902   CALCIUM 9.0 04/18/2012 0924   GFRNONAA 37* 01/06/2014 2156   GFRAA 43* 01/06/2014 2156    CMP     Component Value Date/Time   NA 138 01/06/2014 2156   NA 142 08/17/2013 0902   NA 138 04/18/2012 0924   K 5.1 01/06/2014 2156   K 4.3 08/17/2013 0902   K 4.5 04/18/2012 0924   CL 101 01/06/2014 2156   CL 104 02/13/2013 1043   CL 97* 04/18/2012 0924   CO2 26 01/06/2014 2156   CO2 25 08/17/2013 0902   CO2 29 04/18/2012 0924   GLUCOSE 209* 01/06/2014 2156   GLUCOSE 150* 08/17/2013 0902   GLUCOSE 141* 02/13/2013 1043    GLUCOSE 160* 04/18/2012 0924   BUN 33* 01/06/2014 2156   BUN 20.2 08/17/2013 0902   BUN 18 04/18/2012 0924   CREATININE 1.58* 01/06/2014 2156   CREATININE 1.1 08/17/2013 0902   CREATININE 1.2 04/18/2012 0924   CALCIUM 9.0 01/06/2014 2156   CALCIUM 9.1 08/17/2013 0902   CALCIUM 9.0 04/18/2012 0924   PROT 5.8* 01/06/2014 2156   PROT 6.5 08/17/2013 0902   PROT 7.2 04/18/2012 0924   ALBUMIN 3.3* 01/06/2014 2156   ALBUMIN 3.4* 08/17/2013 0902   AST 40* 01/06/2014 2156   AST 17 08/17/2013 0902   AST 26 04/18/2012 0924   ALT 34 01/06/2014 2156   ALT 8 08/17/2013 0902   ALT 19 04/18/2012 0924   ALKPHOS 96 01/06/2014 2156   ALKPHOS 77 08/17/2013 0902   ALKPHOS 74 04/18/2012 0924   BILITOT 0.4 01/06/2014 2156   BILITOT 0.69 08/17/2013 0902   BILITOT 0.80 04/18/2012 0924   GFRNONAA 37* 01/06/2014 2156   GFRAA 43* 01/06/2014 2156      Time In Time Out Total Time Spent with Patient Total Overall Time  0900 1015 70 min 75 min    Greater than 50%  of this time was spent counseling and coordinating care related to the above assessment and plan.  Wadie Lessen NP  Palliative Medicine Team Team Phone # [phone] Pager [phone]  Discussed with Dr. Renne Crigler

## 2014-01-07 NOTE — Progress Notes (Signed)
ANTIBIOTIC CONSULT NOTE - INITIAL  Pharmacy Consult for Zosyn Indication: suspected Intra-abdominal  infection  Allergies  Allergen Reactions  . Codeine Nausea And Vomiting  . 3p Nol [Acetaminophen] Other (See Comments)    [name], pt's step son, states pt takes tylenol now. States he had reaction to tylenol with codeine . It was codeine which made pt sick.    Patient Measurements: Height: 5\' 11"  (180.3 cm) Weight: 166 lb 14.2 oz (75.7 kg) IBW/kg (Calculated) : 75.3   Vital Signs: Temp: 99.7 F (37.6 C) (03/12 0330) Temp src: Oral (03/12 0330) BP: 139/77 mmHg (03/12 0315) Pulse Rate: 87 (03/12 0315) Intake/Output from previous day: 03/11 0701 - 03/12 0700 In: 1525 [I.V.:1525] Out: 600 [Urine:600] Intake/Output from this shift: Total I/O In: 1525 [I.V.:1525] Out: 600 [Urine:600]  Labs:  Recent Labs  01/06/14 2156 01/07/14 0225  WBC 10.2 8.7  HGB 9.0* 8.8*  PLT 151 133*  CREATININE 1.58*  --    Estimated Creatinine Clearance: 33.1 ml/min (by C-G formula based on Cr of 1.58). No results found for this basename: VANCOTROUGH, VANCOPEAK, VANCORANDOM, GENTTROUGH, GENTPEAK, GENTRANDOM, TOBRATROUGH, TOBRAPEAK, TOBRARND, AMIKACINPEAK, AMIKACINTROU, AMIKACIN,  in the last 72 hours   Microbiology: No results found for this or any previous visit (from the past 720 hour(s)).  Medical History: Past Medical History  Diagnosis Date  . Blood in stool april 2011  . Hypertension   . IBS (irritable bowel syndrome)   . Glaucoma   . Stroke   . Chronic kidney disease   . Diabetes mellitus   . Wears dentures   . Arthritis   . Hyperlipidemia   . Osteoporosis   . Hearing loss   . Nasal congestion   . Difficulty urinating   . Arthritis pain   . Weakness   . Confusion   . Cancer     colon  . Metastatic cancer to liver 05/15/2012    Medications:  Scheduled:  . insulin aspart  0-9 Units Subcutaneous TID WC  . piperacillin-tazobactam (ZOSYN)  IV  3.375 g Intravenous Q8H  .  sodium chloride  3 mL Intravenous Q12H   Infusions:  . sodium chloride    . pantoprozole (PROTONIX) infusion 8 mg/hr (01/07/14 0300)   Assessment: 80 yo with hx of metastatic GI stromal tumor, DM, A-fib, HTN admitted with increasing confusion and abdominal pain.  Zosyn per Rx for suspected intra-abdominal infection.  Goal of Therapy:  Treat infection  Plan:   Zosyn 3.375 Gm IV q8h EI infusion  F/u SCr/cultures as needed  Lawana Pai R 01/07/2014,3:53 AM

## 2014-01-07 NOTE — Progress Notes (Signed)
Admission history completed with son (POA).

## 2014-01-08 DIAGNOSIS — R451 Restlessness and agitation: Secondary | ICD-10-CM | POA: Diagnosis not present

## 2014-01-08 DIAGNOSIS — IMO0002 Reserved for concepts with insufficient information to code with codable children: Secondary | ICD-10-CM

## 2014-01-08 LAB — URINE CULTURE: Colony Count: 15000

## 2014-01-08 LAB — TYPE AND SCREEN
ABO/RH(D): O POS
Antibody Screen: NEGATIVE
UNIT DIVISION: 0
UNIT DIVISION: 0

## 2014-01-08 MED ORDER — LORAZEPAM 2 MG/ML IJ SOLN
1.0000 mg | INTRAMUSCULAR | Status: DC | PRN
  Administered 2014-01-08 – 2014-01-09 (×2): 1 mg via INTRAVENOUS
  Filled 2014-01-08 (×2): qty 1

## 2014-01-08 MED ORDER — HALOPERIDOL LACTATE 5 MG/ML IJ SOLN: 1.0000 mg | INTRAMUSCULAR | Status: DC | PRN

## 2014-01-08 MED ORDER — VITAMINS A & D EX OINT
TOPICAL_OINTMENT | CUTANEOUS | Status: AC
  Filled 2014-01-08: qty 5

## 2014-01-08 MED ORDER — MORPHINE SULFATE (CONCENTRATE) 20 MG/ML PO SOLN: 10.0000 mg | ORAL | Status: AC | PRN

## 2014-01-08 MED ORDER — LORAZEPAM 2 MG/ML PO CONC: 1.0000 mg | ORAL | Status: AC | PRN

## 2014-01-08 NOTE — Progress Notes (Addendum)
CSW received notification from [facility] that facility has bed available for pt tomorrow 01/09/14.  CSW discussed with pt son via telephone who is agreeable to pt transitioning to [facility] tomorrow.   CSW notified MD.  [facility] requesting that pt arrive to [facility] by 10 am.   Weekend CSW to fax final discharge summary to [phone] and RN to call report to [phone].   RN updated.   Weekend CSW to facilitate pt discharge needs tomorrow morning.  Alison Murray, MSW, [place] [facility] [phone]

## 2014-01-08 NOTE — Progress Notes (Signed)
Clinical Social Work Department BRIEF PSYCHOSOCIAL ASSESSMENT 01/08/2014  Patient:  Phillip Chapman, Phillip Chapman     Account Number:  [id number]     Admit date:  01/06/2014  Clinical Social Worker:  Ulyess Blossom  Date/Time:  01/08/2014 02:25 PM  Referred by:  Physician  Date Referred:  01/08/2014 Referred for  Residential hospice placement   Other Referral:   Interview type:  Family Other interview type:    PSYCHOSOCIAL DATA Living Status:  FAMILY Admitted from facility:   Level of care:   Primary support name:  Francee Piccolo Jones/[phone] Primary support relationship to patient:  CHILD, ADULT Degree of support available:   strong    CURRENT CONCERNS Current Concerns  Post-Acute Placement   Other Concerns:    SOCIAL WORK ASSESSMENT / PLAN CSW received referral for residential hospice placement.    CSW met with pt son and pt daughter-in-law at bedside. CSW introduced self and explained role. Pt son and pt daughter-in-law were anticipating CSW visit from earlier discussion with PMT NP, Wadie Lessen. CSW provided supportive listening as pt son discussed that he is aware of pt needs for residential hospice. Pt son discussed that pt oncologists prepared pt son for potential changes in pt and pt son knew when he began to see those changes that it was time to focus on pt comfort.    CSW discussed process of residential hospice placement and provided residential hospice facility list. Pt son expressed that he would agreeable to either [facility] or [facility]. Pt son discussed that pt son and other family live in [place] and [place] are approximately the same distance from pt family home.    CSW made referral to [facility].    CSW made referral to [facility], [facility].    CSW to continue to follow to provide support and assist with residential hospice placement.   Assessment/plan status:  Psychosocial Support/Ongoing  Assessment of Needs Other assessment/ plan:   discharge planning   Information/referral to community resources:   Residential Hospice placement    PATIENT'S/FAMILY'S RESPONSE TO PLAN OF CARE: Per disoriented x 4. Pt appears agitated and pt son discussed that medication has an effect on pt and pt son discussed that he gets agitated on medication. Pt son coping appropriately with pt poor prognosis and wants concentration to be pts comfort.    Alison Murray, MSW, [place] [facility] [phone]

## 2014-01-08 NOTE — Progress Notes (Signed)
PROGRESS NOTE  Phillip Chapman [id number] DOB: 12-Jul-1921 DOA: 01/06/2014 PCP: Jani Gravel, MD  HPI:  Assessment/Plan: Goals of care - palliative care has been consulted while patient hospitalized. Given progressive decline and new bleeding related to his cancer, after discussions with the family patient was transitioned to comfort care and placement to residential hospice is planned. Patient last saw Dr. Benay Spice about a month ago with no plans to continue chemotherapy or follow up with Oncology and hospice was discussed at that point.  Intraperitoneal and intra liver lesion bleed with known history of metastatic GI stromal tumor Acute encephalopathy UTI  Acute renal failure probably from dehydration  Acute blood loss anemia Diabetes mellitus type 2 History of hypertension History of atrial fibrillation.  Code Status: DNR Family Communication: none  Disposition Plan: residential hospice  Consultants:  Palliative  Procedures:  none   Antibiotics - none  HPI/Subjective: - appears comfortable  Objective: Filed Vitals:   01/07/14 1200 01/07/14 1600 01/07/14 1959 01/08/14 0530  BP:   156/73 149/70  Pulse:   62 81  Temp: 98.4 F (36.9 C) 99.1 F (37.3 C) 98.6 F (37 C) 97.1 F (36.2 C)  TempSrc: Axillary Axillary Axillary Axillary  Resp:   18 18  Height:   5\' 11"  (1.803 m)   Weight:   77.565 kg (171 lb)   SpO2:   98% 95%    Intake/Output Summary (Last 24 hours) at 01/08/14 1447 Last data filed at 01/08/14 0531  Gross per 24 hour  Intake    230 ml  Output   1725 ml  Net  -1495 ml   Filed Weights   01/07/14 0315 01/07/14 1959  Weight: 75.7 kg (166 lb 14.2 oz) 77.565 kg (171 lb)    Exam:  General:  NAD, comfortable  Cardiovascular: regular rate and rhythm, without MRG Respiratory: mostly clear on anterior auscultation  Data Reviewed: Basic Metabolic Panel:  Recent Labs Lab 01/06/14 2156 01/07/14 0915  NA 138 138  K 5.1 4.0  CL 101 103    CO2 26 22  GLUCOSE 209* 160*  BUN 33* 23  CREATININE 1.58* 1.29  CALCIUM 9.0 8.8   Liver Function Tests:  Recent Labs Lab 01/06/14 2156 01/07/14 0915  AST 40* 37  ALT 34 31  ALKPHOS 96 91  BILITOT 0.4 1.1  PROT 5.8* 5.8*  ALBUMIN 3.3* 3.0*    Recent Labs Lab 01/06/14 2156  LIPASE 22    Recent Labs Lab 01/06/14 2340  AMMONIA 19   CBC:  Recent Labs Lab 01/06/14 2156 01/07/14 0225 01/07/14 0915  WBC 10.2 8.7 8.4  HGB 9.0* 8.8* 10.8*  HCT 27.0* 26.6* 31.2*  MCV 87.4 87.8 87.2  PLT 151 133* 120*   CBG:  Recent Labs Lab 01/07/14 0620 01/07/14 0800 01/07/14 1154  GLUCAP 180* 175* 131*    Recent Results (from the past 240 hour(s))  MRSA PCR SCREENING     Status: None   Collection Time    01/07/14  4:01 AM      Result Value Ref Range Status   MRSA by PCR NEGATIVE  NEGATIVE Final   Comment:            The GeneXpert MRSA Assay (FDA     approved for NASAL specimens     only), is one component of a     comprehensive MRSA colonization     surveillance program. It is not     intended to diagnose MRSA  infection nor to guide or     monitor treatment for     MRSA infections.  URINE CULTURE     Status: None   Collection Time    01/07/14  8:06 AM      Result Value Ref Range Status   Specimen Description URINE, CATHETERIZED   Final   Special Requests NONE   Final   Culture  Setup Time     Final   Value: 01/07/2014 10:06     Performed at [facility]     Final   Value: 15,000 COLONIES/ML     Performed at [facility]   Culture     Final   Value: Multiple bacterial morphotypes present, none predominant. Suggest appropriate recollection if clinically indicated.     Performed at [facility]   Report Status 01/08/2014 FINAL   Final     Studies: Ct Head Wo Contrast  01/07/2014   CLINICAL DATA Altered mental status.  EXAM CT HEAD WITHOUT CONTRAST  TECHNIQUE Contiguous axial images were obtained from the base  of the skull through the vertex without intravenous contrast.  COMPARISON CT scan of January 18, 2011.  FINDINGS Bony calvarium appears intact. Mild diffuse cortical atrophy is noted. Chronic ischemic white matter disease is noted. No mass effect or midline shift is noted. Ventricular size is within normal limits. There is no evidence of mass lesion, hemorrhage or acute infarction.  IMPRESSION Diffuse cortical atrophy. Chronic ischemic white matter disease. No acute intracranial abnormality seen.  SIGNATURE  Electronically Signed   By: Sabino Dick M.D.   On: 01/07/2014 00:25   Ct Abdomen Pelvis W Contrast  01/07/2014   CLINICAL DATA Abdominal pain  EXAM CT ABDOMEN AND PELVIS WITH CONTRAST  TECHNIQUE Multidetector CT imaging of the abdomen and pelvis was performed using the standard protocol following bolus administration of intravenous contrast.  CONTRAST 65mL OMNIPAQUE IOHEXOL 300 MG/ML  SOLN  COMPARISON 07/30/2013  FINDINGS Normal heart size. Coronary artery and aortic valvular calcifications. Bibasilar opacities.  Multifocal liver lesions, increased in size in the interval. The largest is within the hepatic dome measuring 5.5 x 7.0 cm, which abuts the liver capsule. Increased intralesional heterogeneity. Within segment 6, there is a 3 cm lesion abutting the liver capsule with suggestion of extension through the capsule on image 29 series 2. Interval development of a perihepatic mixed attenuation as well as high attenuation perisplenic and dependently within the pelvis.  Layering gallstones.  No biliary ductal dilatation.  Atrophy of the pancreas. Unremarkable spleen and adrenal glands. Multiple renal cysts and incompletely characterized hypodensities. No hydroureteronephrosis.  Anterior abdominal wall laxity. Colonic diverticulosis. No overt colitis or diverticulitis. Right hemicolectomy with ileocolonic anastomosis. No bowel obstruction. No free intraperitoneal air. Small hiatal hernia. No lymphadenopathy.   Advanced atherosclerotic disease of the aorta and branch vessels. No aneurysmal dilatation.  Bladder wall thickening. Foley catheter in place. Multiple bladder diverticula.  Osteopenia and multilevel degenerative changes.  IMPRESSION Multifocal liver lesions, some of which have increased in size and complexity.  The dominant mass at the hepatic dome shows intralesional high attenuation, most in keeping with hemorrhage. There is extracapsular extension, with a small to moderate amount of intraperitoneal blood collecting perihepatic, perisplenic, and dependently within the pelvis.  Segment 6 lesion also appears to extend to or through the liver capsule.  Bibasilar opacities; atelectasis, aspiration, or infiltrate.  Bladder wall thickening may reflect cystitis. Correlate with urinalysis.  Other nonemergent findings are  similar to prior as above.  Critical Value/emergent results were called by telephone at the time of interpretation on 01/07/2014 at 12:40 AM to Dr. Kathrynn Humble, who verbally acknowledged these results.  SIGNATURE  Electronically Signed   By: Carlos Levering M.D.   On: 01/07/2014 00:48    Scheduled Meds: . 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 Any transcriptional errors are unintentional.   Marzetta Board, MD [facility] Pager [phone]. If 7 PM - 7 AM, please contact night-coverage at [email or web address], password [facility] 01/08/2014, 2:47 PM  LOS: 2 days

## 2014-01-08 NOTE — Progress Notes (Signed)
Progress Note from the Palliative Medicine Team at [facility]:  -pateitn is awake, confused and easily agitaed  -family at bedside, continued conversation regarding diagnosis, prognosis, disposition and options.  -Natural trajectory and expectations at EOL were discussed.  Questions and concerns addressed. Focus of care is comfort and family is hopeful for in patient residential hospice.  -Prognosis is  likely days to a week with need for expert symptom management    Objective: Allergies  Allergen Reactions  . Codeine Nausea And Vomiting  . 3p Nol [Acetaminophen] Other (See Comments)    [name], pt's step son, states pt takes tylenol now. States he had reaction to tylenol with codeine . It was codeine which made pt sick.   Scheduled Meds: . sodium chloride  3 mL Intravenous Q12H   Continuous Infusions:  PRN Meds:.haloperidol lactate, LORazepam, morphine injection, ondansetron (ZOFRAN) IV  BP 149/70  Pulse 81  Temp(Src) 97.1 F (36.2 C) (Axillary)  Resp 18  Ht 5\' 11"  (1.803 m)  Wt 77.565 kg (171 lb)  BMI 23.86 kg/m2  SpO2 95%   PPS: 20 % * Intake/Output Summary (Last 24 hours) at 01/08/14 1105 Last data filed at 01/08/14 0531  Gross per 24 hour  Intake    230 ml  Output   1725 ml  Net  -1495 ml       Physical Exam:  General: ill appearing, easily agitated, NAD HEENT:  Mm, no exudate, + temporal muscle wasting Chest:   Scattered coarse BS CVS: RRR Abdomen:soft NT, decreased BS Ext: without edema Neuro: unable to follow commands, altered mental status  Labs: CBC    Component Value Date/Time   WBC 8.4 01/07/2014 0915   WBC 4.3 10/02/2013 0852   RBC 3.58* 01/07/2014 0915   RBC 4.01* 10/02/2013 0852   HGB 10.8* 01/07/2014 0915   HGB 12.7* 10/02/2013 0852   HCT 31.2* 01/07/2014 0915   HCT 37.9* 10/02/2013 0852   PLT 120* 01/07/2014 0915   PLT 110* 10/02/2013 0852   MCV 87.2 01/07/2014 0915   MCV 94.4 10/02/2013 0852   MCH 30.2 01/07/2014 0915   MCH 31.6  10/02/2013 0852   MCHC 34.6 01/07/2014 0915   MCHC 33.4 10/02/2013 0852   RDW 13.4 01/07/2014 0915   RDW 16.7* 10/02/2013 0852   LYMPHSABS 1.6 10/02/2013 0852   LYMPHSABS 1.1 11/18/2011 1100   MONOABS 0.5 10/02/2013 0852   MONOABS 1.0 11/18/2011 1100   EOSABS 0.1 10/02/2013 0852   EOSABS 0.0 11/18/2011 1100   BASOSABS 0.0 10/02/2013 0852   BASOSABS 0.0 11/18/2011 1100    BMET    Component Value Date/Time   NA 138 01/07/2014 0915   NA 142 08/17/2013 0902   NA 138 04/18/2012 0924   K 4.0 01/07/2014 0915   K 4.3 08/17/2013 0902   K 4.5 04/18/2012 0924   CL 103 01/07/2014 0915   CL 104 02/13/2013 1043   CL 97* 04/18/2012 0924   CO2 22 01/07/2014 0915   CO2 25 08/17/2013 0902   CO2 29 04/18/2012 0924   GLUCOSE 160* 01/07/2014 0915   GLUCOSE 150* 08/17/2013 0902   GLUCOSE 141* 02/13/2013 1043   GLUCOSE 160* 04/18/2012 0924   BUN 23 01/07/2014 0915   BUN 20.2 08/17/2013 0902   BUN 18 04/18/2012 0924   CREATININE 1.29 01/07/2014 0915   CREATININE 1.1 08/17/2013 0902   CREATININE 1.2 04/18/2012 0924   CALCIUM 8.8 01/07/2014 0915   CALCIUM 9.1 08/17/2013 0902   CALCIUM 9.0  04/18/2012 0924   GFRNONAA 47* 01/07/2014 0915   GFRAA 55* 01/07/2014 0915    CMP     Component Value Date/Time   NA 138 01/07/2014 0915   NA 142 08/17/2013 0902   NA 138 04/18/2012 0924   K 4.0 01/07/2014 0915   K 4.3 08/17/2013 0902   K 4.5 04/18/2012 0924   CL 103 01/07/2014 0915   CL 104 02/13/2013 1043   CL 97* 04/18/2012 0924   CO2 22 01/07/2014 0915   CO2 25 08/17/2013 0902   CO2 29 04/18/2012 0924   GLUCOSE 160* 01/07/2014 0915   GLUCOSE 150* 08/17/2013 0902   GLUCOSE 141* 02/13/2013 1043   GLUCOSE 160* 04/18/2012 0924   BUN 23 01/07/2014 0915   BUN 20.2 08/17/2013 0902   BUN 18 04/18/2012 0924   CREATININE 1.29 01/07/2014 0915   CREATININE 1.1 08/17/2013 0902   CREATININE 1.2 04/18/2012 0924   CALCIUM 8.8 01/07/2014 0915   CALCIUM 9.1 08/17/2013 0902   CALCIUM 9.0 04/18/2012 0924   PROT 5.8* 01/07/2014 0915   PROT 6.5  08/17/2013 0902   PROT 7.2 04/18/2012 0924   ALBUMIN 3.0* 01/07/2014 0915   ALBUMIN 3.4* 08/17/2013 0902   AST 37 01/07/2014 0915   AST 17 08/17/2013 0902   AST 26 04/18/2012 0924   ALT 31 01/07/2014 0915   ALT 8 08/17/2013 0902   ALT 19 04/18/2012 0924   ALKPHOS 91 01/07/2014 0915   ALKPHOS 77 08/17/2013 0902   ALKPHOS 74 04/18/2012 0924   BILITOT 1.1 01/07/2014 0915   BILITOT 0.69 08/17/2013 0902   BILITOT 0.80 04/18/2012 0924   GFRNONAA 47* 01/07/2014 0915   GFRAA 55* 01/07/2014 0915    Assessment and Plan: 1. Code Status: DNR/DNI-comfort is main focus of care 2. Symptom Control: 1. Anxiety/Agitation: Ativan 1 mg IV every 4 hrs prn 2. Pain/Dyspnea: Morphine 2-4 mgs IV every 1 hr prn 3. Agitation: Haldol 1 mg every 4 hrs prn 3. Psycho/Social:  Emotional support offered to family at bedside. Patient's wife with dementia adding another layer of complexity for the son.  Questions and concerns addressed 4. Spiritual Chaplain consulted 5. Disposition:  Hopeful for residential, will write for choice  Patient Documents Completed or Given: Document Given Completed  Advanced Directives Pkt    MOST yes   DNR    Gone from My Sight    Hard Choices yes     Time In Time Out Total Time Spent with Patient Total Overall Time  1325- 1400 35 min 35 min    Greater than 50%  of this time was spent counseling and coordinating care related to the above assessment and plan.  Wadie Lessen NP  Palliative Medicine Team Team Phone # [phone] Pager [phone]  Discussed with Dr Cruzita Lederer and Cristal Deer LCSW 1

## 2014-01-08 NOTE — Progress Notes (Signed)
Nutrition Brief Note   Patient screened by Malnutrition Screening Tool Chart reviewed. Pt now transitioning to comfort care.  No further nutrition interventions warranted at this time.  Please re-consult as needed.   Atlee Abide MS RD LDN Clinical Dietitian [phone]

## 2014-01-08 NOTE — Discharge Summary (Signed)
Physician Discharge Summary  Phillip Chapman [id number] DOB: 26-Jun-1921 DOA: 01/06/2014  PCP: Jani Gravel, MD  Admit date: 01/06/2014 Discharge date: 01/08/2014  Time spent: 35 minutes  Recommendations for Outpatient Follow-up:  1. 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 male with history of metastatic GI stromal tumor, diabetes mellitus, atrial fibrillation, hypertension was brought to the ER because of increasing confusion and abdominal pain. Patient's son who provided most of the history states that patient has been increasingly confused over the last 2 months but last one week his confusion has worsened. Yesterday patient started having abdominal pain and had in addition vomiting. In the ER patient had CT head which did not show anything acute since patient complain of abdominal pain a CT abdomen and pelvis was done which shows intra-lesional hemorrhage in the liver and intraperitoneal bleed. In addition patient is also stool for occult blood positive. Patient is mildly febrile and UA shows features consistent with UTI. At this time on call surgeon Dr. Excell Seltzer was consulted by ER physician as per surgery patient is not a candidate for any surgical intervention. Surgery is recommended interventional radiology if he is further bleeding Goals of care - palliative care has been  consulted while patient hospitalized. Given progressive decline and new bleeding related to his cancer, after palliative team discussed with the family and patient's care was transitioned more towards comfort rather than further aggressive interventions. Patient last saw his oncologist Dr. Benay Spice about a month ago with no plans to continue chemotherapy or follow up with Oncology and hospice was discussed at that point. Intraperitoneal and intra liver lesion bleed with known history of metastatic GI stromal tumor  Acute encephalopathy  UTI  Acute renal failure probably from dehydration  Acute blood loss anemia  Diabetes mellitus type 2  Hypertension  History of atrial fibrillation.  Per palliative meeting 01/07/2014,   1. Code Status: DNR/DNI--comfort is focus of care  2. Scope of Treatment:  1. Vital Signs: daily  2. Respiratory/Oxygen:for comfort only 3. Nutritional Support/Tube Feeds:no artificial feeding now or in the future 4. Antibiotics: no 5. Blood Products: no 6. IVF: KVO  7. Review of Medications to be discontinued: minimize to comfort 8. Labs:none 9. Telemetry:none 10. Consults:none  Procedures:  none   Consultations:  Palliative   Discharge Exam: Filed Vitals:   01/08/14 0530 01/08/14 1501 01/08/14 2255 01/09/14 0511  BP: 149/70 150/70 142/71 138/54  Pulse: 81 80 80 47  Temp: 97.1 F (36.2 C) 97.8 F (36.6 C) 97.6 F (36.4 C) 100 F (37.8 C)  TempSrc: Axillary Axillary Axillary Axillary  Resp: 18 18 18 16   Height:      Weight:      SpO2: 95% 95% 96% 95%   General: NAD Cardiovascular: RRR Respiratory: CTA biL  Discharge Instructions    Medication List    STOP taking these medications  montelukast 10 MG tablet  Commonly known as:  SINGULAIR     pravastatin 20 MG tablet  Commonly known as:  PRAVACHOL     PRESCRIPTION MEDICATION      TAKE these medications       aspirin EC 81 MG tablet  Take 81 mg by mouth daily.     b complex  vitamins capsule  Take 1 capsule by mouth daily.     CREON 24000 UNITS Cpep  Generic drug:  Pancrelipase (Lip-Prot-Amyl)  Take 1 capsule by mouth 3 (three) times daily.     donepezil 10 MG tablet  Commonly known as:  ARICEPT  Take 10 mg by mouth at bedtime.     ferrous sulfate 325 (65 FE) MG tablet  Take 325 mg by mouth 2 (two) times daily with a meal.     finasteride 5 MG tablet  Commonly known as:  PROSCAR  Take 5 mg by mouth daily.     fish oil-omega-3 fatty acids 1000 MG capsule  Take 1 g by mouth daily.     loratadine 10 MG tablet  Commonly known as:  CLARITIN  Take 10 mg by mouth daily.     LORazepam 2 MG/ML concentrated solution  Commonly known as:  ATIVAN  Take 0.5 mLs (1 mg total) by mouth every 4 (four) hours as needed for anxiety.     losartan 50 MG tablet  Commonly known as:  COZAAR  Take 1 tablet (50 mg total) by mouth daily. Need appointment before anymore refils     metFORMIN 500 MG (MOD) 24 hr tablet  Commonly known as:  GLUMETZA  Take 500 mg by mouth daily with supper.     metoprolol succinate 25 MG 24 hr tablet  Commonly known as:  TOPROL-XL  Take 25 mg by mouth daily.     morphine 20 MG/ML concentrated solution  Commonly known as:  ROXANOL  Take 0.5 mLs (10 mg total) by mouth every 4 (four) hours as needed for severe pain.     omeprazole 20 MG capsule  Commonly known as:  PRILOSEC  Take 20 mg by mouth daily.     polyethylene glycol packet  Commonly known as:  MIRALAX / GLYCOLAX  Take 17 g by mouth daily as needed for mild constipation.     tamsulosin 0.4 MG Caps capsule  Commonly known as:  FLOMAX  Take 0.4 mg by mouth at bedtime.        The results of significant diagnostics from this hospitalization (including imaging, microbiology, ancillary and laboratory) are listed below for reference.    Significant Diagnostic Studies: Ct Head Wo Contrast  01/07/2014   CLINICAL DATA Altered mental status.  EXAM CT HEAD WITHOUT CONTRAST   TECHNIQUE Contiguous axial images were obtained from the base of the skull through the vertex without intravenous contrast.  COMPARISON CT scan of January 18, 2011.  FINDINGS Bony calvarium appears intact. Mild diffuse cortical atrophy is noted. Chronic ischemic white matter disease is noted. No mass effect or midline shift is noted. Ventricular size is within normal limits. There is no evidence of mass lesion, hemorrhage or acute infarction.  IMPRESSION Diffuse cortical atrophy. Chronic ischemic white matter disease. No acute intracranial abnormality seen.  SIGNATURE  Electronically Signed   By: Sabino Dick M.D.   On: 01/07/2014 00:25   Ct Abdomen Pelvis W Contrast  01/07/2014   CLINICAL DATA Abdominal pain  EXAM CT ABDOMEN AND PELVIS WITH CONTRAST  TECHNIQUE Multidetector CT imaging  of the abdomen and pelvis was performed using the standard protocol following bolus administration of intravenous contrast.  CONTRAST 62mL OMNIPAQUE IOHEXOL 300 MG/ML  SOLN  COMPARISON 07/30/2013  FINDINGS Normal heart size. Coronary artery and aortic valvular calcifications. Bibasilar opacities.  Multifocal liver lesions, increased in size in the interval. The largest is within the hepatic dome measuring 5.5 x 7.0 cm, which abuts the liver capsule. Increased intralesional heterogeneity. Within segment 6, there is a 3 cm lesion abutting the liver capsule with suggestion of extension through the capsule on image 29 series 2. Interval development of a perihepatic mixed attenuation as well as high attenuation perisplenic and dependently within the pelvis.  Layering gallstones.  No biliary ductal dilatation.  Atrophy of the pancreas. Unremarkable spleen and adrenal glands. Multiple renal cysts and incompletely characterized hypodensities. No hydroureteronephrosis.  Anterior abdominal wall laxity. Colonic diverticulosis. No overt colitis or diverticulitis. Right hemicolectomy with ileocolonic anastomosis. No bowel obstruction. No free  intraperitoneal air. Small hiatal hernia. No lymphadenopathy.  Advanced atherosclerotic disease of the aorta and branch vessels. No aneurysmal dilatation.  Bladder wall thickening. Foley catheter in place. Multiple bladder diverticula.  Osteopenia and multilevel degenerative changes.  IMPRESSION Multifocal liver lesions, some of which have increased in size and complexity.  The dominant mass at the hepatic dome shows intralesional high attenuation, most in keeping with hemorrhage. There is extracapsular extension, with a small to moderate amount of intraperitoneal blood collecting perihepatic, perisplenic, and dependently within the pelvis.  Segment 6 lesion also appears to extend to or through the liver capsule.  Bibasilar opacities; atelectasis, aspiration, or infiltrate.  Bladder wall thickening may reflect cystitis. Correlate with urinalysis.  Other nonemergent findings are similar to prior as above.  Critical Value/emergent results were called by telephone at the time of interpretation on 01/07/2014 at 12:40 AM to Dr. Kathrynn Humble, who verbally acknowledged these results.  SIGNATURE  Electronically Signed   By: Carlos Levering M.D.   On: 01/07/2014 00:48    Microbiology: Recent Results (from the past 240 hour(s))  MRSA PCR SCREENING     Status: None   Collection Time    01/07/14  4:01 AM      Result Value Ref Range Status   MRSA by PCR NEGATIVE  NEGATIVE Final   Comment:            The GeneXpert MRSA Assay (FDA     approved for NASAL specimens     only), is one component of a     comprehensive MRSA colonization     surveillance program. It is not     intended to diagnose MRSA     infection nor to guide or     monitor treatment for     MRSA infections.  URINE CULTURE     Status: None   Collection Time    01/07/14  8:06 AM      Result Value Ref Range Status   Specimen Description URINE, CATHETERIZED   Final   Special Requests NONE   Final   Culture  Setup Time     Final   Value: 01/07/2014  10:06     Performed at [facility]     Final   Value: 15,000 COLONIES/ML     Performed at [facility]   Culture     Final   Value: Multiple bacterial morphotypes present, none predominant. Suggest appropriate recollection if clinically indicated.     Performed at [facility]  Report Status 01/08/2014 FINAL   Final     Labs: Basic Metabolic Panel:  Recent Labs Lab 01/06/14 2156 01/07/14 0915  NA 138 138  K 5.1 4.0  CL 101 103  CO2 26 22  GLUCOSE 209* 160*  BUN 33* 23  CREATININE 1.58* 1.29  CALCIUM 9.0 8.8   Liver Function Tests:  Recent Labs Lab 01/06/14 2156 01/07/14 0915  AST 40* 37  ALT 34 31  ALKPHOS 96 91  BILITOT 0.4 1.1  PROT 5.8* 5.8*  ALBUMIN 3.3* 3.0*    Recent Labs Lab 01/06/14 2156  LIPASE 22    Recent Labs Lab 01/06/14 2340  AMMONIA 19   CBC:  Recent Labs Lab 01/06/14 2156 01/07/14 0225 01/07/14 0915  WBC 10.2 8.7 8.4  HGB 9.0* 8.8* 10.8*  HCT 27.0* 26.6* 31.2*  MCV 87.4 87.8 87.2  PLT 151 133* 120*   CBG:  Recent Labs Lab 01/07/14 0620 01/07/14 0800 01/07/14 1154  GLUCAP 180* 175* 131*   Signed:  Coco Sharpnack  [facility] 01/08/2014, 4:48 PM

## 2014-01-08 NOTE — Consult Note (Signed)
[facility] Liaison: Received request from [place] for family interest in [facility]. Chart reviewed. Unfortunately no [employer] availability at this time. CSW aware. Will follow until disposition determined and update CSW and family if availability changes for this patient. Thank you. Erling Conte LCSW [phone]

## 2014-01-09 NOTE — Progress Notes (Signed)
Per MD, Pt ready for d/c.  Notified RN, family and facility.  Spoke with Pt's son via phone.  Pt's son aware of transfer and Pt's on on his way to the [facility] in [place] now.  Sent d/c summary.  LM for admitting RN.  Facility ready to receive Pt.  Arranged for transportation.  RN to give report.  Bernita Raisin, [place] [facility] [phone]

## 2014-01-09 NOTE — Progress Notes (Signed)
Patient discharged to [facility] via ambulance, message left at [phone] for RN to call me back for report.

## 2014-01-12 NOTE — Consult Note (Signed)
I have reviewed and discussed the care of this patient in detail with the nurse practitioner including pertinent patient records, physical exam findings and data. I agree with details of this encounter.  

## 2014-01-13 ENCOUNTER — Encounter: Payer: Self-pay | Admitting: *Deleted

## 2014-01-13 NOTE — Progress Notes (Signed)
Notification from [facility] that patient died 31-Jan-2014. MD notified.

## 2014-01-27 DEATH — deceased

## 2014-07-15 IMAGING — CT CT BIOPSY
1 series · 1 of 31 positions shown · non-contrast
Comparison: none

CT GUIDED FINE NEEDLE ASPIRATE AND CORE BIOPSY OF LIVER

Date: 05/15/12
CLINICAL HISTORY: 89-year-old male with a history of malignant GIST
and a new hepatic lesion.  He had undergone a prior nondiagnostic
ultrasound guided biopsy and presents today for CT-guided attempts
at obtaining tissue diagnosis.

[Series 2: localizer · axial · 5.0mm · 0.87mm/px · 1 of 31 slices shown]
[im 16/31]
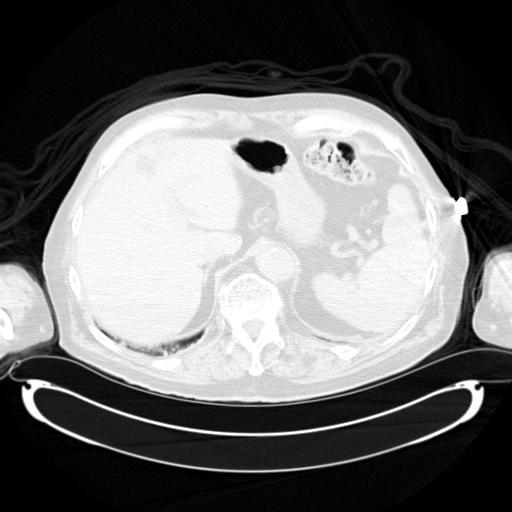

[1 of 31 positions shown; findings below may reference images not displayed]

Procedures Performed:
1.  CT guided fine needle aspirates of hepatic lesion
2.  CT-guided core biopsy of hepatic lesion

Sedation: Moderate (conscious) sedation was used.  2 mg Versed, 100
mcg Fentanyl were administered intravenously.  The patient's vital
signs were monitored continuously by radiology nursing throughout
the procedure.

Sedation Time: 45 minutes

PROCEDURE/FINDINGS:

 Informed consent was obtained from the patient and his stepson
following explanation of the procedure, risks, benefits and
alternatives.  The patient understands, agrees and consents for the
procedure.  All questions were addressed. A time out was performed.

Maximal barrier sterile technique utilized including caps, mask,
sterile gowns, sterile gloves, large sterile drape, hand hygiene,
and skin prep with Betadine and.

A planning axial CT scan was performed.  The lesion was localized
and a suitable skin entry site selected and marked.  Anesthesia was
obtained with infiltration of 1% lidocaine.  A 17 gauge trocar
needle was then advanced to the skin, underlying soft tissues and
into to the hepatic lesion.  This was performed under CT guidance.
Needle position was confirmed with axial CT imaging.  Of note, the
lesion is very firm and rolled away from the needle. Multiple
repositioning were required to successfully secured the tip of the
needle within the suspicious hepatic lesion.  Several fine needle
aspirates were then obtained with a 21 gauge Chiba needle.
Cytopathologic evaluation confirmed cellular material therefore
confirming position of the needle within the mass.  Several 18-
gauge biopsies were then obtained with the Biopince automated
biopsy device.

The needle was removed and hemostasis obtained with manual
pressure.  Post biopsy axial CT scan was performed which
demonstrated no evidence of complication.  Specifically, there is
no pneumothorax or significant perihepatic hemorrhage.

The patient tolerated the procedure well, there was no immediate
complication.  The patient was discharged home in good condition
after four hours observation.
IMPRESSION: Successful CT guided fine needle aspirate and core biopsy of
hepatic lesion.

[REDACTED]

## 2015-05-25 ENCOUNTER — Encounter: Payer: Self-pay | Admitting: *Deleted
